# Patient Record
Sex: Female | Born: 1970 | Race: Black or African American | Hispanic: No | Marital: Married | Smoking: Light tobacco smoker
Health system: Southern US, Community
[De-identification: ages and names within clinical notes are randomized; demographics above are authoritative.]

## PROBLEM LIST (undated history)

## (undated) ENCOUNTER — Inpatient Hospital Stay (HOSPITAL_COMMUNITY): Admission: EM | Payer: Medicare Other

## (undated) DIAGNOSIS — I10 Essential (primary) hypertension: Secondary | ICD-10-CM

## (undated) DIAGNOSIS — Z9889 Other specified postprocedural states: Secondary | ICD-10-CM

## (undated) DIAGNOSIS — G5600 Carpal tunnel syndrome, unspecified upper limb: Secondary | ICD-10-CM

## (undated) DIAGNOSIS — M5020 Other cervical disc displacement, unspecified cervical region: Secondary | ICD-10-CM

## (undated) DIAGNOSIS — N289 Disorder of kidney and ureter, unspecified: Secondary | ICD-10-CM

## (undated) DIAGNOSIS — T8859XA Other complications of anesthesia, initial encounter: Secondary | ICD-10-CM

## (undated) DIAGNOSIS — K589 Irritable bowel syndrome without diarrhea: Secondary | ICD-10-CM

## (undated) DIAGNOSIS — R112 Nausea with vomiting, unspecified: Secondary | ICD-10-CM

## (undated) DIAGNOSIS — E7800 Pure hypercholesterolemia, unspecified: Secondary | ICD-10-CM

## (undated) DIAGNOSIS — M1990 Unspecified osteoarthritis, unspecified site: Secondary | ICD-10-CM

## (undated) HISTORY — PX: ABDOMINAL HYSTERECTOMY: SHX81

## (undated) HISTORY — PX: COLONOSCOPY: SHX174

## (undated) HISTORY — PX: CARPAL TUNNEL RELEASE: SHX101

## (undated) HISTORY — DX: Unspecified osteoarthritis, unspecified site: M19.90

## (undated) HISTORY — DX: Pure hypercholesterolemia, unspecified: E78.00

## (undated) HISTORY — PX: HAMMER TOE SURGERY: SHX385

## (undated) HISTORY — PX: CHOLECYSTECTOMY: SHX55

---

## 2000-07-17 ENCOUNTER — Other Ambulatory Visit: Admission: RE | Admit: 2000-07-17 | Discharge: 2000-07-17 | Payer: Self-pay | Admitting: Obstetrics and Gynecology

## 2001-06-11 ENCOUNTER — Ambulatory Visit (HOSPITAL_COMMUNITY): Admission: RE | Admit: 2001-06-11 | Discharge: 2001-06-11 | Payer: Self-pay | Admitting: Obstetrics and Gynecology

## 2001-07-18 ENCOUNTER — Encounter (INDEPENDENT_AMBULATORY_CARE_PROVIDER_SITE_OTHER): Payer: Self-pay

## 2001-07-18 ENCOUNTER — Ambulatory Visit (HOSPITAL_COMMUNITY): Admission: RE | Admit: 2001-07-18 | Discharge: 2001-07-18 | Payer: Self-pay | Admitting: Obstetrics and Gynecology

## 2001-10-16 ENCOUNTER — Encounter (INDEPENDENT_AMBULATORY_CARE_PROVIDER_SITE_OTHER): Payer: Self-pay

## 2001-10-16 ENCOUNTER — Inpatient Hospital Stay (HOSPITAL_COMMUNITY): Admission: RE | Admit: 2001-10-16 | Discharge: 2001-10-19 | Payer: Self-pay | Admitting: Obstetrics and Gynecology

## 2002-01-23 ENCOUNTER — Encounter: Admission: RE | Admit: 2002-01-23 | Discharge: 2002-01-23 | Payer: Self-pay | Admitting: Sports Medicine

## 2002-02-06 ENCOUNTER — Encounter: Admission: RE | Admit: 2002-02-06 | Discharge: 2002-02-06 | Payer: Self-pay | Admitting: Family Medicine

## 2003-12-23 ENCOUNTER — Ambulatory Visit: Payer: Self-pay | Admitting: Sports Medicine

## 2003-12-23 ENCOUNTER — Ambulatory Visit (HOSPITAL_COMMUNITY): Admission: RE | Admit: 2003-12-23 | Discharge: 2003-12-23 | Payer: Self-pay | Admitting: Sports Medicine

## 2004-01-13 ENCOUNTER — Ambulatory Visit: Payer: Self-pay | Admitting: Family Medicine

## 2004-02-03 ENCOUNTER — Other Ambulatory Visit: Admission: RE | Admit: 2004-02-03 | Discharge: 2004-02-03 | Payer: Self-pay | Admitting: Obstetrics and Gynecology

## 2004-02-10 ENCOUNTER — Encounter: Admission: RE | Admit: 2004-02-10 | Discharge: 2004-02-10 | Payer: Self-pay | Admitting: Obstetrics and Gynecology

## 2004-02-24 ENCOUNTER — Ambulatory Visit: Payer: Self-pay | Admitting: Sports Medicine

## 2004-03-10 ENCOUNTER — Ambulatory Visit: Payer: Self-pay | Admitting: Sports Medicine

## 2004-04-11 ENCOUNTER — Ambulatory Visit: Payer: Self-pay | Admitting: Sports Medicine

## 2004-08-01 ENCOUNTER — Encounter: Admission: RE | Admit: 2004-08-01 | Discharge: 2004-08-01 | Payer: Self-pay | Admitting: Obstetrics and Gynecology

## 2004-08-18 ENCOUNTER — Ambulatory Visit (HOSPITAL_BASED_OUTPATIENT_CLINIC_OR_DEPARTMENT_OTHER): Admission: RE | Admit: 2004-08-18 | Discharge: 2004-08-18 | Payer: Self-pay | Admitting: Orthopedic Surgery

## 2004-08-18 ENCOUNTER — Ambulatory Visit (HOSPITAL_COMMUNITY): Admission: RE | Admit: 2004-08-18 | Discharge: 2004-08-18 | Payer: Self-pay | Admitting: Orthopedic Surgery

## 2004-12-12 ENCOUNTER — Encounter: Admission: RE | Admit: 2004-12-12 | Discharge: 2004-12-12 | Payer: Self-pay | Admitting: Cardiology

## 2005-02-17 ENCOUNTER — Encounter: Admission: RE | Admit: 2005-02-17 | Discharge: 2005-02-17 | Payer: Self-pay | Admitting: Obstetrics and Gynecology

## 2005-04-05 ENCOUNTER — Encounter: Admission: RE | Admit: 2005-04-05 | Discharge: 2005-04-05 | Payer: Self-pay | Admitting: Neurology

## 2005-06-30 ENCOUNTER — Ambulatory Visit (HOSPITAL_BASED_OUTPATIENT_CLINIC_OR_DEPARTMENT_OTHER): Admission: RE | Admit: 2005-06-30 | Discharge: 2005-06-30 | Payer: Self-pay | Admitting: Orthopedic Surgery

## 2005-09-15 ENCOUNTER — Encounter: Admission: RE | Admit: 2005-09-15 | Discharge: 2005-09-15 | Payer: Self-pay | Admitting: Obstetrics and Gynecology

## 2005-11-24 ENCOUNTER — Encounter: Admission: RE | Admit: 2005-11-24 | Discharge: 2005-11-24 | Payer: Self-pay | Admitting: Gastroenterology

## 2006-05-17 DIAGNOSIS — E669 Obesity, unspecified: Secondary | ICD-10-CM

## 2006-05-17 DIAGNOSIS — I10 Essential (primary) hypertension: Secondary | ICD-10-CM

## 2006-05-17 DIAGNOSIS — D509 Iron deficiency anemia, unspecified: Secondary | ICD-10-CM | POA: Insufficient documentation

## 2006-08-31 ENCOUNTER — Ambulatory Visit (HOSPITAL_COMMUNITY): Admission: RE | Admit: 2006-08-31 | Discharge: 2006-08-31 | Payer: Self-pay | Admitting: Sports Medicine

## 2006-11-28 ENCOUNTER — Encounter: Admission: RE | Admit: 2006-11-28 | Discharge: 2006-11-28 | Payer: Self-pay | Admitting: Obstetrics and Gynecology

## 2007-03-26 ENCOUNTER — Encounter: Admission: RE | Admit: 2007-03-26 | Discharge: 2007-03-26 | Payer: Self-pay | Admitting: Cardiology

## 2007-08-01 ENCOUNTER — Emergency Department (HOSPITAL_COMMUNITY): Admission: EM | Admit: 2007-08-01 | Discharge: 2007-08-01 | Payer: Self-pay | Admitting: Emergency Medicine

## 2009-02-27 ENCOUNTER — Emergency Department (HOSPITAL_COMMUNITY): Admission: EM | Admit: 2009-02-27 | Discharge: 2009-02-27 | Payer: Self-pay | Admitting: Family Medicine

## 2010-04-09 ENCOUNTER — Encounter: Payer: Self-pay | Admitting: Obstetrics and Gynecology

## 2010-04-10 ENCOUNTER — Encounter: Payer: Self-pay | Admitting: Obstetrics and Gynecology

## 2010-04-10 ENCOUNTER — Encounter: Payer: Self-pay | Admitting: Orthopedic Surgery

## 2010-04-10 ENCOUNTER — Encounter: Payer: Self-pay | Admitting: Sports Medicine

## 2010-05-12 ENCOUNTER — Encounter (INDEPENDENT_AMBULATORY_CARE_PROVIDER_SITE_OTHER): Payer: Self-pay | Admitting: Internal Medicine

## 2010-05-12 ENCOUNTER — Telehealth (INDEPENDENT_AMBULATORY_CARE_PROVIDER_SITE_OTHER): Payer: Self-pay | Admitting: Internal Medicine

## 2010-05-12 ENCOUNTER — Encounter: Payer: Self-pay | Admitting: Internal Medicine

## 2010-05-12 DIAGNOSIS — K644 Residual hemorrhoidal skin tags: Secondary | ICD-10-CM | POA: Insufficient documentation

## 2010-05-12 DIAGNOSIS — Z8669 Personal history of other diseases of the nervous system and sense organs: Secondary | ICD-10-CM | POA: Insufficient documentation

## 2010-05-12 DIAGNOSIS — D259 Leiomyoma of uterus, unspecified: Secondary | ICD-10-CM | POA: Insufficient documentation

## 2010-05-12 DIAGNOSIS — L84 Corns and callosities: Secondary | ICD-10-CM | POA: Insufficient documentation

## 2010-05-12 DIAGNOSIS — M5030 Other cervical disc degeneration, unspecified cervical region: Secondary | ICD-10-CM | POA: Insufficient documentation

## 2010-05-12 DIAGNOSIS — K589 Irritable bowel syndrome without diarrhea: Secondary | ICD-10-CM | POA: Insufficient documentation

## 2010-05-12 LAB — CONVERTED CEMR LAB
Blood in Urine, dipstick: NEGATIVE
Glucose, Urine, Semiquant: NEGATIVE
Nitrite: NEGATIVE
Protein, U semiquant: 30
Specific Gravity, Urine: 1.02
Urobilinogen, UA: 1
WBC Urine, dipstick: NEGATIVE
pH: 6.5

## 2010-05-17 NOTE — Assessment & Plan Note (Signed)
Summary: New pt. to establish   Vital Signs:  Patient profile:   40 year old female Menstrual status:  hysterectomy Height:      68 inches Weight:      216 pounds BMI:     32.96 Temp:     98.3 degrees F oral Pulse rate:   72 / minute Pulse rhythm:   regular Resp:     22 per minute BP sitting:   134 / 96  (left arm) Cuff size:   regular  Vitals Entered By: Aquilla Solian CMA (May 12, 2010 11:34 AM) CC: Here to establish. Was at [facility]. Was Dr. Carma Leaven. Was dx w/IBS. Has had 2 colonoscopy and 2 endoscopy in the past. Having blood in stools. Denies any mucous. Have abd. pain. Dr. Micheline Chapman @ [facility] Medicine dx her w/arthritis in the groin area toes. Has plantation callus. Callus build up in hands as well. Needing BP meds.    Is Patient Diabetic? No Pain Assessment Patient in pain? yes       Does patient need assistance? Functional Status Self care Ambulation Normal     Menstrual Status hysterectomy   CC:  Here to establish. Was at [facility]. Was Dr. Carma Leaven. Was dx w/IBS. Has had 2 colonoscopy and 2 endoscopy in the past. Having blood in stools. Denies any mucous. Have abd. pain. Dr. Micheline Chapman @ [facility] dx her w/arthritis in the groin area toes. Has plantation callus. Callus build up in hands as well. Needing BP meds.   .  History of Present Illness: 40 yo female here to establish.  1.  Bowel issues:  Hx of IBS--diagnosed with Dr. Cristina Gong.  Having blood in stool and abdominal cramping.  States has been a problem for about 1 year.  Stools are soft.  Pt. describes BRBPR.  States only on tissue unless strains--states she strains sometimes even when stools are soft.  Tried multiple meds with Dr. Cristina Gong.  Never found a medication regiment that worked without significant side effects.  Dr. Cristina Gong had recommended she go to [facility] for research trials, which she never did.  Had 2 colonoscopies and 2 EGDs, all of which were  reportedly normal with Dr. Cristina Gong.  Pt. states also has difficulties with gag reflex and vomiting, which is also a part of her GI issues.  Has had hemorrhoids in past.  Has had a hemorrhoidectomy 10 years ago, but they have returned.  Describes external hemorrhoids.  Dr. Cristina Gong has told her there is nothing more he can do for her.  2.  Hypertension:  Takes Micardis/HCTZ.  Has been using only once every other week as she was running out.  Having headaches since not taking regularly  3.  Left Foot Pain:  Was going to Dr. Micheline Chapman at [facility].  Has a callous over 5th head of Metacarpal.  Was told bone would need to be shaved.  Has not been able to get into Dr. Micheline Chapman.    4.  Cold Intolerance:  has been this way for about 1 1/2 years.  Current Medications (verified): 1)  None  Allergies (verified): 1)  ! Penicillin  Past History:  Past Surgical History: 1.  08/18/1993:  Cholecystectomy  2.  07/18/2001:   colonoscopy/endoscopy--nl  3.  08/18/2001:  TAH for fibroids and menorrhagia with anemia 4.  08/18/2004:  Left Carpal Tunnel Release 5.  06/30/2005:  Right Carpal Tunnel Release  Family History: Mother, 32:  Lung Cancer, Breast Cancer, DM, hypertension Father, died 40:  MI, leukemia, gout, DM, Sickle Cell Trait 3 Sisters:  All with hypertension, IBS.  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 Lives with her adopted son, age 64 yo. Tobacco Use:  started age 29.  Smokes 1 pp week Alcohol:  occasional Drugs:  no history   Physical Exam  General:  Obese, NAD Lungs:  Normal respiratory effort, chest expands symmetrically. Lungs are clear to auscultation, no crackles or wheezes. Heart:  Normal rate and regular rhythm. S1 and S2 normal without gallop, murmur, click, rub or other extra sounds.  Radial pulses normal and equal Extremities:  No edema  Callous over metatarsal head area of  left 5th digit--tender.  No erythema.   Impression & Recommendations:  Problem # 1:  DEGENERATIVE DISC DISEASE, CERVICAL SPINE (ICD-722.4) Pt. was taking unknown muscle relaxant for neck/back--will call and let us know what it was.  Problem # 2:  CALLUSES, LEFT FOOT (ICD-700) Discussed wearing boxy toed shoes. Orders: UA Dipstick w/o Micro (automated)  (81003) Orthopedic Referral (Ortho)  Dr. Wolfgang Phoenix see if he takes Medicare.  Problem # 3:  EXTERNAL HEMORRHOIDS (ICD-455.3)  Orders: UA Dipstick w/o Micro (automated)  (81003)  Problem # 4:  IRRITABLE BOWEL SYNDROME (ICD-564.1) Need records from Dr. Cristina Gong Orders: UA Dipstick w/o Micro (automated)  (81003)  Problem # 5:  HYPERTENSION, BENIGN SYSTEMIC (ICD-401.1) Restart regular medication Her updated medication list for this problem includes:    Micardis Hct 80-12.5 Mg Tabs (Telmisartan-hctz) .Marland Kitchen... 1 tab by mouth daily  Orders: T-Comprehensive Metabolic Panel (A999333) UA Dipstick w/o Micro (automated)  (81003)  Complete Medication List: 1)  Micardis Hct 80-12.5 Mg Tabs (Telmisartan-hctz) .Marland Kitchen.. 1 tab by mouth daily 2)  Proctosol Hc 2.5 % Crea (Hydrocortisone) .... Apply three times a day to affected area (hemorrhoids) as needed  Other Orders: T-Lipid Profile [id number]) T-CBC w/Diff [id number])  Patient Instructions: 1)  Release of info from Dr. Cristina Gong.  2)  Release of info  Dr Micheline Chapman, Ouida Sills, [place] 3)  Follow up with Dr. Amil Amen in 4 months --CPE--no pap  Prescriptions: PROCTOSOL HC 2.5 % CREA (HYDROCORTISONE) apply three times a day to affected area (hemorrhoids) as needed  #60 g x 0   Entered and Authorized by:   Mack Hook MD   Signed by:   Mack Hook MD on 05/12/2010   Method used:   Electronically to        [facility] (retail)       [place], [place]  [id number]       Ph: [phone]       Fax: [phone]   [id number] MICARDIS HCT 80-12.5 MG TABS (TELMISARTAN-HCTZ) 1 tab by mouth daily  #30 x 11   Entered and Authorized by:   Mack Hook MD   Signed by:   Mack Hook MD on 05/12/2010   Method used:   Electronically to        [facility] (retail)       [place], [place]  [id number]       Ph: [phone]       Fax: [phone]   RxID:   [id number]    Orders Added: 1)  T-Lipid Profile [id number] 2)  T-Comprehensive  Metabolic Panel [id number] 3)  T-CBC w/Diff [id number] 4)  New Patient Level II [99202] 5)  UA Dipstick w/o Micro (automated)  [81003] 6)  Orthopedic Referral [Ortho]   Not Administered:    Influenza Vaccine not given due to: declined

## 2010-05-17 NOTE — Progress Notes (Signed)
  Phone Note Outgoing Call   Summary of Call: [name]--referral back to Dr. Micheline Chapman  ?  [facility]--not sure if this is [facility] or not.  She followed with him previously Initial call taken by: Mack Hook MD,  May 12, 2010 12:55 PM     Appended Document: PODIATRY REFERRAL  OOPs--signed off--sending via an append  Pt went to [facility] orthopedic . I called to schedule an appt and they told me that the pt have a balance of $30.00  she need to paid it  before we schedule an appt . I lvm to pt to call me back to let her know the situation.Maren Reamer  May 12, 2010 3:55 PM      Clinical Lists Changes

## 2010-05-20 ENCOUNTER — Encounter (INDEPENDENT_AMBULATORY_CARE_PROVIDER_SITE_OTHER): Payer: Self-pay | Admitting: Internal Medicine

## 2010-05-25 ENCOUNTER — Encounter (INDEPENDENT_AMBULATORY_CARE_PROVIDER_SITE_OTHER): Payer: Self-pay | Admitting: Internal Medicine

## 2010-05-25 ENCOUNTER — Telehealth (INDEPENDENT_AMBULATORY_CARE_PROVIDER_SITE_OTHER): Payer: Self-pay | Admitting: Internal Medicine

## 2010-05-25 DIAGNOSIS — E7800 Pure hypercholesterolemia, unspecified: Secondary | ICD-10-CM | POA: Insufficient documentation

## 2010-05-25 LAB — CONVERTED CEMR LAB
ALT: 12 units/L (ref 0–53)
AST: 15 units/L (ref 0–37)
Albumin: 4.4 g/dL (ref 3.5–5.2)
Alkaline Phosphatase: 62 units/L (ref 39–117)
BUN: 9 mg/dL (ref 6–23)
Basophils Absolute: 0 10*3/uL (ref 0.0–0.1)
Basophils Relative: 0 % (ref 0–1)
CO2: 21 meq/L (ref 19–32)
Calcium: 9.7 mg/dL (ref 8.4–10.5)
Chloride: 104 meq/L (ref 96–112)
Cholesterol: 218 mg/dL — ABNORMAL HIGH (ref 0–200)
Creatinine, Ser: 0.85 mg/dL (ref 0.40–1.50)
Eosinophils Absolute: 0 10*3/uL (ref 0.0–0.7)
Eosinophils Relative: 1 % (ref 0–5)
Glucose, Bld: 87 mg/dL (ref 70–99)
HCT: 39.8 % (ref 39.0–52.0)
HDL: 46 mg/dL (ref >39)
Hemoglobin: 13.1 g/dL (ref 13.0–17.0)
LDL Cholesterol: 156 mg/dL — ABNORMAL HIGH (ref 0–99)
Lymphocytes Relative: 44 % (ref 12–46)
Lymphs Abs: 2.5 10*3/uL (ref 0.7–4.0)
MCHC: 32.9 g/dL (ref 30.0–36.0)
MCV: 82.9 fL (ref 78.0–100.0)
Monocytes Absolute: 0.6 10*3/uL (ref 0.1–1.0)
Monocytes Relative: 10 % (ref 3–12)
Neutro Abs: 2.5 10*3/uL (ref 1.7–7.7)
Neutrophils Relative %: 45 % (ref 43–77)
Platelets: 237 10*3/uL (ref 150–400)
Potassium: 4.7 meq/L (ref 3.5–5.3)
RBC: 4.8 M/uL (ref 4.22–5.81)
RDW: 14.2 % (ref 11.5–15.5)
Sodium: 138 meq/L (ref 135–145)
Total Bilirubin: 0.7 mg/dL (ref 0.3–1.2)
Total CHOL/HDL Ratio: 4.7
Total Protein: 8 g/dL (ref 6.0–8.3)
Triglycerides: 79 mg/dL (ref <150)
VLDL: 16 mg/dL (ref 0–40)
WBC: 5.6 10*3/uL (ref 4.0–10.5)

## 2010-05-26 NOTE — Letter (Signed)
Summary: REQUESTING RECORDS FROM DR.DRAPER  REQUESTING RECORDS FROM DR.DRAPER   Imported By: Roland Earl 05/20/2010 12:20:38  _____________________________________________________________________  External Attachment:    Type:   Image     Comment:   External Document

## 2010-05-26 NOTE — Letter (Signed)
Summary: REQUESTING RECORDS FROM DR.[place]  REQUESTING RECORDS FROM DR.SPRIULL   Imported By: Roland Earl 05/20/2010 12:22:34  _____________________________________________________________________  External Attachment:    Type:   Image     Comment:   External Document

## 2010-05-26 NOTE — Letter (Signed)
Summary: REQUESTING RECORDS FROM DR.[place]  REQUESTING RECORDS FROM DR.[place]   Imported By: Roland Earl 05/20/2010 12:24:34  _____________________________________________________________________  External Attachment:    Type:   Image     Comment:   External Document

## 2010-05-26 NOTE — Letter (Signed)
Summary: REQUESTING RECORDS FROM DR.ANDERSON  REQUESTING RECORDS FROM DR.ANDERSON   Imported By: Roland Earl 05/20/2010 12:26:15  _____________________________________________________________________  External Attachment:    Type:   Image     Comment:   External Document

## 2010-05-31 NOTE — Assessment & Plan Note (Signed)
Summary: UA results   Allergies: 1)  ! Penicillin   Complete Medication List: 1)  Micardis Hct 80-12.5 Mg Tabs (Telmisartan-hctz) .Marland Kitchen.. 1 tab by mouth daily 2)  Proctosol Hc 2.5 % Crea (Hydrocortisone) .... Apply three times a day to affected area (hemorrhoids) as needed     Laboratory Results   Urine Tests  Date/Time Received: May 12, 2010 6:18 PM   Routine Urinalysis   Color: yellow Glucose: negative   (Normal Range: Negative) Bilirubin: small   (Normal Range: Negative) Ketone: small (15)   (Normal Range: Negative) Spec. Gravity: 1.020   (Normal Range: 1.003-1.035) Blood: negative   (Normal Range: Negative) pH: 6.5   (Normal Range: 5.0-8.0) Protein: 30   (Normal Range: Negative) Urobilinogen: 1.0   (Normal Range: 0-1) Nitrite: negative   (Normal Range: Negative) Leukocyte Esterace: negative   (Normal Range: Negative)

## 2010-05-31 NOTE — Progress Notes (Signed)
Summary: LAB RESULTS   Phone Note Call from Patient Call back at [phone]   Caller: Patient Reason for Call: Lab or Test Results Summary of Call: PT WANTS TO KNOW HER LAB RESULTS Derrek Monaco CALL HER @ [phone] [place]  Initial call taken by: Maren Reamer,  May 25, 2010 8:47 AM  Follow-up for Phone Call        Will foward to Dr. Amil Amen. -- Aquilla Solian CMA  May 25, 2010 10:39 AM

## 2010-05-31 NOTE — Letter (Signed)
Summary: Lipid Letter  [facility]  [address] [place], [place] [address]   Phone: [phone]  Fax: [phone]    05/25/2010  Veronica Pollard [address] [place], [place]  [address]  Dear Veronica Pollard:  We have carefully reviewed your last lipid profile from 05/12/2010 and the results are noted below with a summary of recommendations for lipid management.    Cholesterol:       218     Goal: <200   HDL "good" Cholesterol:   46     Goal: >45   LDL "bad" Cholesterol:   156     Goal: <100   Triglycerides:       79     Goal: <150    Total cholesterol and LDL are a bit elevated.  Encourage working on diet and physical activity and rechecking in 4-6 months.  Rest of labs, urine were fine.    TLC Diet (Therapeutic Lifestyle Change): Saturated Fats & Transfatty acids should be kept < 7% of total calories ***Reduce Saturated Fats Polyunstaurated Fat can be up to 10% of total calories Monounsaturated Fat Fat can be up to 20% of total calories Total Fat should be no greater than 25-35% of total calories Carbohydrates should be 50-60% of total calories Protein should be approximately 15% of total calories Fiber should be at least 20-30 grams a day ***Increased fiber may help lower LDL Total Cholesterol should be < 200mg /day Consider adding plant stanol/sterols to diet (example: Benacol spread) ***A higher intake of unsaturated fat may reduce Triglycerides and Increase HDL    Adjunctive Measures (may lower LIPIDS and reduce risk of Heart Attack) include: Aerobic Exercise (20-30 minutes 3-4 times a week) Limit Alcohol Consumption Weight Reduction Aspirin 75-81 mg a day by mouth (if not allergic or contraindicated) Dietary Fiber 20-30 grams a day by mouth     Current Medications: 1)    Micardis Hct 80-12.5 Mg Tabs (Telmisartan-hctz) .Marland Kitchen.. 1 tab by mouth daily 2)    Proctosol Hc 2.5 % Crea (Hydrocortisone) .... Apply three times a day to affected  area (hemorrhoids) as needed  If you have any questions, please call. We appreciate being able to work with you.   Sincerely,    [facility]

## 2010-06-13 ENCOUNTER — Other Ambulatory Visit: Payer: Self-pay | Admitting: Podiatry

## 2010-06-21 LAB — HEMOCCULT GUIAC POC 1CARD (OFFICE): Fecal Occult Bld: NEGATIVE

## 2010-08-05 NOTE — Discharge Summary (Signed)
   NAME:  Veronica Pollard, Veronica Pollard NO.:  [id number]   MEDICAL RECORD NO.:  [id number]                   PATIENT TYPE:  INP   LOCATION:  9309                                 FACILITY:  [facility]   PHYSICIAN:  Freda Munro, MD                   DATE OF BIRTH:  1970/10/30   DATE OF ADMISSION:  10/16/2001  DATE OF DISCHARGE:  10/19/2001                                 DISCHARGE SUMMARY   PRINCIPAL DISCHARGE DIAGNOSES:  1. Menometorrhagia.  2. Symptomatic uterine fibroids.  3. Severe anemia.   PRINCIPAL PROCEDURE:  Total abdominal hysterectomy.   HOSPITAL COURSE:  The patient is a 40 year old white female G-0, P-0 who  presented to [facility] on 10-16-01 for total abdominal hysterectomy  secondary to symptomatic uterine fibroids, severe anemia and  menometorrhagia.   Complete description of the event which lead up to this hospitalization can  be found in the dictated history and physical.   The patient underwent a total abdominal hysterectomy on 10-16-01. A complete  description of this procedure can be found on the dictated operative note.   The patient's pathology revealed a 342 gram uterus with multiple uterine  leiomyomata. No malignancy was identified. The patient's preoperative  hemoglobin was 7.6 and postop 8.0.   The patient's hospital course was uncomplicated. The patient was discharged  home on postoperative day #3.   At the time of discharge the patient was ambulating without difficulty.  She  was eating a regular diet. The patient was discharged to home and instructed  to follow up in the office in four weeks.  She was sent home with Motrin 600  mg p.o. q.d and Demerol.                                               Freda Munro, MD    MA/MEDQ  D:  11/11/2001  T:  11/11/2001  Job:  [id number]

## 2010-08-05 NOTE — Op Note (Signed)
[place]  Patient:    Veronica Pollard, Veronica Pollard Visit Number: [id number] MRN: [id number]          Service Type: DSU Location: [facility] Attending Physician:  Jules Husbands Dictated by:   Marney Setting Ouida Sills, M.D. Proc. Date: 07/18/01 Admit Date:  07/18/2001                             Operative Report  PREOPERATIVE DIAGNOSES:       1. Menorrhagia.                               2. Severe anemia.  POSTOPERATIVE DIAGNOSES:      1. Menorrhagia.                               2. Severe anemia.  OPERATION:                    Dilatation and curettage.  SURGEON:                      Mark E. Ouida Sills, M.D.  ANESTHESIA:                   MAC, paracervical block.  DRAINS:                       Red rubber catheter to bladder.  SPECIMENS:                    Endometrial curettings sent to pathology.  COMPLICATIONS:                None.  ESTIMATED BLOOD LOSS:         Minimal.  INDICATIONS:                  Ms. Veronica Pollard is a 40 year old black female who has a long history of uterine fibroids.  In the last six months, she has had increasing menorrhagia.  The patient was seen approximately one month ago and was found to have a hemoglobin of 6.1.  At that time, she was placed on Depo-Lupron and Procrit in an attempt to get her hemoglobin up for definitive surgery.  The patient is a Designer, television/film set and refuses blood products.  The patient has continued to bleed for the last three weeks.  Her hemorrhage has not changed during this time.  In an attempt to slow the bleeding, a D&C is scheduled for today.  DESCRIPTION OF PROCEDURE:     The patient was taken to the operating room where she was placed in the dorsal lithotomy position and MAC anesthesia was administered.  She was then prepped with Hibiclens and her bladder was drained with a red rubber catheter.  The sterile speculum was placed in the vagina.  A single-tooth tenaculum was applied and 20 cc of 1%  lidocaine was used for paracervical block.  The cervical os was then dilated to a 31-French.  The uterus was sounded to 11 cm.  The uterine curetting was then performed without significant tissue being removed.  At the conclusion of the procedure, minimum amount of bleeding was still noted.  The patient will be discharged to home. She will follow up in the office in five days. Dictated by:  Mark E. Ouida Sills, M.D. Attending Physician:  Jules Husbands DD:  07/18/01 TD:  07/20/01 Job: [id number]

## 2010-08-05 NOTE — H&P (Signed)
NAME:  Veronica Pollard, Veronica Pollard                         ACCOUNT NO.:  [id number]   MEDICAL RECORD NO.:  [id number]                   PATIENT TYPE:  INP   LOCATION:  9309                                 FACILITY:  [facility]   PHYSICIAN:  Mark E. Ouida Sills, M.D.              DATE OF BIRTH:  1971/03/01   DATE OF ADMISSION:  10/16/2001  DATE OF DISCHARGE:                                HISTORY & PHYSICAL   HISTORY OF PRESENT ILLNESS:  The patient is a 40 year old black female, G0,  P0, who presents today for a total abdominal hysterectomy secondary to  uterine fibroids, severe anemia, and menometrorrhagia.  The patient has had  this problem for multiple years.  She had been treated with oral  contraceptive pills, Lupron, Procrit, and a D&C.  The patient was offered  endometrial ablation, myomectomy and uterine embolization and declined these  options.  The patient did undergo a D&C after she was bleeding heavity in  May 2003.  The pathology revealed proliferative endometrium.  The patient's  hemoglobin continues to be in the 7's despite taking iron twice a day.  The  patient is a Jehovah Witness and refuses any blood products.  The patient  did undergo a colonoscopy to rule out other sources of blood loss.  This was  completely negative.   PAST MEDICAL HISTORY:  The patient has an allergy to penicillin which she  says causes her to stop breathing.   SOCIAL HISTORY:  The patient smokes rarely.   FAMILY HISTORY:  Significant for diabetes, heart disease and hypertension.   CURRENT MEDICATIONS:  Iron twice a day.  Most recent hemoglobin was 7.8.   PHYSICAL EXAMINATION:   GENERAL:  The patient is a well-developed, well-nourished white female.  She  is overweight, 260 pounds.   HEENT:  Within normal limits.   LUNGS:  Clear to auscultation.   CARDIOVASCULAR:  Regular rate and rhythm without murmur.   BREASTS:  Without lesions or tenderness.  There is no lymphadenopathy.   ABDOMEN:  Soft,  nontender, nondistended.  There is no organomegaly, no  masses.  She has no rebound or guarding.   EXTREMITIES:  Within normal limits.   PELVIC:  The patient has normal external genitalia.  Vagina is without  lesions or discharge.  The cervix is nulliparous.  Uterus is functionally 14  week size.  There are no adnexal masses.  The uterus is lobulated consistent  with fibroids.   IMPRESSION:  1. Uterine fibroids.  2. Severe anemia.  3. Menometrorrhagia   PLAN:  Proceed with total abdominal hysterectomy.                                                 Mark E. Ouida Sills, M.D.    MEA/MEDQ  D:  10/16/2001  T:  10/18/2001  Job:  [id number]

## 2010-08-05 NOTE — Op Note (Signed)
NAMEMARREN, LULL NO.:  [id number]   MEDICAL RECORD NO.:  [id number]          PATIENT TYPE:  AMB   LOCATION:  [place]                          FACILITY:  [facility]   PHYSICIAN:  Youlanda Mighty. Sypher, M.D. DATE OF BIRTH:  1970/12/15   DATE OF PROCEDURE:  08/18/2004  DATE OF DISCHARGE:                                 OPERATIVE REPORT   REFERRING PHYSICIAN:  Lucianne Lei, M.D.   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   OPERATION:  Release of left transverse carpal ligament.   OPERATING SURGEON:  Youlanda Mighty. Sypher, M.D.   ASSISTANT:  Herbie Baltimore Dasnoit PA-C.   ANESTHESIA:  General by LMA.   SUPERVISING ANESTHESIOLOGIST:  Dr. Albertina Parr.   INDICATIONS:  Veronica Pollard is a 40 year old woman referred through the  courtesy of Dr. Lucianne Lei for evaluation and management of bilateral hand  numbness. Clinical examination suggested carpal tunnel syndrome.  Electrodiagnostic studies completed by Dr. Zebedee Iba confirmed bilateral  median neuropathy at wrist level.   Due to failed to respond to nonoperative measures, she is brought to the  operating room at this time for release of her left transverse carpal  ligament.   PROCEDURE:  Veronica Pollard is brought to the operating room and placed in  supine position upon the operating table.   Following the induction of general anesthesia by LMA, the left arm was  prepped with Betadine soap and solution and sterilely draped.   Following exsanguination the left arm with an Esmarch bandage, an arterial  tourniquet on the proximal brachium was inflated to 220 mmHg.   Procedure commenced with a short incision in the line of the ring finger and  the palm.  Subcutaneous tissues were carefully divided revealing the palmar  fascia. This was split longitudinally to reveal the common sensory  branch of  the median nerve.   These were followed back to transverse carpal ligament which was gently  isolated from the median nerve.   The transverse carpal ligament was released along its ulnar border extending  into the distal forearm. This widely opened carpal canal. No masses or other  predicaments were noted.   Bleeding points along the margin of the released ligament were  electrocauterized with bipolar current followed by repair the skin with  intradermal 3-0 Prolene suture.   A compressive dressing was applied with a volar plaster splint maintaining  the wrist in 5 degrees of dorsiflexion.      RVS/MEDQ  D:  08/18/2004  T:  08/18/2004  Job:  [id number]   cc:   Lucianne Lei, M.D.  [phone], [address]  [place]  [place] [address]  Fax: [phone]

## 2010-08-05 NOTE — Op Note (Signed)
NAME:  Veronica Pollard, Veronica Pollard                         ACCOUNT NO.:  [id number]   MEDICAL RECORD NO.:  [id number]                   PATIENT TYPE:  INP   LOCATION:  9309                                 FACILITY:  [facility]   PHYSICIAN:  Mark E. Ouida Sills, M.D.              DATE OF BIRTH:  October 27, 1970   DATE OF PROCEDURE:  10/16/2001  DATE OF DISCHARGE:  10/19/2001                                 OPERATIVE REPORT   PREOPERATIVE DIAGNOSES:  1. Uterine fibroids.  2. Severe anemia.  3. Menorrhagia.   POSTOPERATIVE DIAGNOSES:  1. Uterine fibroids.  2. Severe anemia.  3. Menorrhagia.  4. The patient is a Restaurant manager, fast food.   PROCEDURE:  Total abdominal hysterectomy.   ANESTHESIA:  General endotracheal.   ANTIBIOTICS:  Ancef 1 g.   SURGEON:  Mark E. Ouida Sills, M.D.   ASSISTANT:  Dr. Deatra Ina.   COMPLICATIONS:  None.   SPECIMENS:  Cervix and uterus sent to pathology.   FINDINGS:  The patient had a normal-appearing liver.  The right kidney  appeared slightly larger than the left kidney.  However, no abnormalities  were felt on it.  There was no abdominal or pelvic lymphadenopathy.  There  were several small adhesions between the colon and the posterior aspect of  the uterus which were taken down sharply.  Ovaries were normal bilaterally.  There were multiple fibroids in the uterus, making it approximately 14 weeks  in size.   DESCRIPTION OF PROCEDURE:  The patient was taken to the operating room where  a general anesthetic was administered without complication.  She was then  prepped with Hibiclens and a Foley catheter was placed.  She was draped in  the usual fashion for this procedure.  A Pfannenstiel incision was made and  it was carried down to the fascia.  The fascia was entered in the midline  and extended laterally with the Mayo scissors.  The rectus muscles were then  dissected from the fascia with the Bovie.  The rectus muscles were divided  in the midline and taken superiorly and  inferiorly.  The parietal peritoneum  was entered sharply and taken superiorly and inferiorly.  An examination of  the abdominal and pelvic contents was undertaken and findings are as noted  above.  The adhesions to the posterior aspect of the uterus were taken down  with the Bovie.  At this point, the O'Connor-O'Sullivan retractor was placed  and the bowel packed away.  The right round ligament was then ligated with 0  Monocryl suture and transected with the Bovie.  The anterior and posterior  leaf of the broad ligament was then opened.  The ureter was identified.  At  this point, a clamp was placed over the ovarian ligament which was then cut  and tied with 2-0 Monocryl sutures.  The bladder flap was then opened.  The  uterine vessels were skeletonized, clamped, cut, and ligated with  0 Monocryl  suture.  On the left, the patient had a large fibroid extending into the  broad ligament, making it difficult for dissection.  First, the round  ligament was grasped, ligated with 0 Monocryl suture, and transected with  the Bovie posteriorly for the broad ligament were then opened and the ureter  identified.  With the ____________ well out of the field, the remaining  bladder flap was then opened.  The uterine vessel was isolated and ligated  with 0 Monocryl suture.  At this point, the uterus was transected from the  cervix, and a thyroid clamp was placed on the cervix.  Next, the cardinal  ligaments were serially bilaterally clamped, cut, and ligated with 0  Monocryl suture.  Once the level of the external os was reached, the vagina  was entered and circumscribed.  The vaginal angles were closed using 0  Monocryl suture in a Heaney fashion.  The remaining vaginal cuff was closed  using interrupted 0 Monocryl sutures in a figure-of-eight fashion.  The  pelvis was then copiously irrigated.  Small areas of bleeding were made  hemostatic with the Bovie.  The ovaries were then attached to the round   ligament with 2-0 Monocryl suture.  This concluded the procedure.  The laps  were removed and retractors removed.  The parietal peritoneum and rectus  muscles were reapproximated in the midline using 2-0 Monocryl in a running  fashion.  The fascia was closed using 0 Monocryl suture in a running  fashion.  The subcuticular tissue was made hemostatic with Bovie and then  closure using interrupted 2-0 plain gut suture.  The stainless steel clips  were then used to close the skin.  The patient tolerated the procedure well.  She was taken to the recovery room in stable condition.  Instrument counts  and lap counts were correct x2.                                                Mark E. Ouida Sills, M.D.    MEA/MEDQ  D:  10/16/2001  T:  10/21/2001  Job:  [id number]

## 2010-08-05 NOTE — Op Note (Signed)
Veronica Pollard, SAUNDERS NO.:  [id number]   MEDICAL RECORD NO.:  [id number]          PATIENT TYPE:  AMB   LOCATION:  [place]                          FACILITY:  [facility]   PHYSICIAN:  Youlanda Mighty. Sypher, M.D. 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   OPERATIONS:  Release of right transcarpal ligament.   OPERATIONS:  Dr. Herbie [place] Sypher   ASSISTANT:  Leverne Humbles PA-C.   ANESTHESIA:  General by LMA.   SUPERVISING ANESTHESIOLOGIST:  Dr. Linna Caprice   INDICATIONS:  [name] is a 39 year old woman referred for evaluation  and management of bilateral hand discomfort and numbness.  She is status  post left carpal tunnel release with good results.  She now presents for  similar surgery on the right.  Preoperative electrodiagnostic studies  documented significant median entrapment neuropathy bilaterally.   PROCEDURE:  [name] is brought to the operating room and placed in  supine position on the operating room table.  Following induction of general  anesthesia by LMA, the right arm was prepped with Betadine soap and solution  and sterilely draped.  A pneumatic tourniquet was applied to the proximal  right brachium.   Following exsanguination of the right arm an Esmarch bandage, the arterial  tourniquet is inflated to 220 mmHg.  The procedure commenced with a short  incision in the line of the ring finger and the palm.  Subcutaneous tissues  were carefully divided revealing palmar fascia.  __________ Harl Bowie of the  median nerve.  The median nerve proper was gently isolated from the  transcarpal ligament.  The ligament was then released with scissors along  its ulnar border extending into the distal forearm.  This widely opened  the  carpal canal.  No masses or other pigments were noted.   Bleeding points along the margin of the released ligament were  electrocauterized with bipolar current followed by repair of the skin with  intradermal 3-0 Prolene suture.   A compressive dressing was applied with a volar plaster splint maintaining  the wrist in 5 degrees of dorsiflexion.   There no apparent complications.   [name] tolerated surgery and anesthesia well.  She is transferred to the  recovery room with stable vital signs.      Youlanda Mighty Sypher, M.D.  Electronically Signed     RVS/MEDQ  D:  06/30/2005  T:  06/30/2005  Job:  [id number]

## 2010-10-28 ENCOUNTER — Other Ambulatory Visit: Payer: Self-pay | Admitting: Family Medicine

## 2010-10-28 ENCOUNTER — Inpatient Hospital Stay (INDEPENDENT_AMBULATORY_CARE_PROVIDER_SITE_OTHER)
Admission: RE | Admit: 2010-10-28 | Discharge: 2010-10-28 | Disposition: A | Discharge status: Home / Self Care | Payer: Medicare Other | Source: Ambulatory Visit | Attending: Family Medicine | Admitting: Family Medicine

## 2010-10-28 DIAGNOSIS — N6452 Nipple discharge: Secondary | ICD-10-CM

## 2010-10-28 DIAGNOSIS — N6459 Other signs and symptoms in breast: Secondary | ICD-10-CM

## 2010-11-02 ENCOUNTER — Ambulatory Visit
Admission: RE | Admit: 2010-11-02 | Discharge: 2010-11-02 | Disposition: A | Discharge status: Home / Self Care | Payer: Medicare Other | Source: Ambulatory Visit | Attending: Family Medicine | Admitting: Family Medicine

## 2010-11-02 ENCOUNTER — Other Ambulatory Visit: Payer: Self-pay | Admitting: Family Medicine

## 2010-11-02 DIAGNOSIS — N6452 Nipple discharge: Secondary | ICD-10-CM

## 2011-07-08 ENCOUNTER — Encounter (HOSPITAL_COMMUNITY): Payer: Self-pay

## 2011-07-08 ENCOUNTER — Emergency Department (HOSPITAL_COMMUNITY)
Admission: EM | Admit: 2011-07-08 | Discharge: 2011-07-08 | Disposition: A | Discharge status: Home / Self Care | Payer: 59 | Source: Home / Self Care | Attending: Emergency Medicine | Admitting: Emergency Medicine

## 2011-07-08 DIAGNOSIS — L851 Acquired keratosis [keratoderma] palmaris et plantaris: Secondary | ICD-10-CM

## 2011-07-08 DIAGNOSIS — L859 Epidermal thickening, unspecified: Secondary | ICD-10-CM

## 2011-07-08 HISTORY — DX: Essential (primary) hypertension: I10

## 2011-07-08 MED ORDER — SALICYLIC ACID 17 % EX SOLN: Freq: Every day | CUTANEOUS | Status: AC

## 2011-07-08 NOTE — ED Provider Notes (Signed)
Chief Complaint  Patient presents with  . Foot Pain    History of Present Illness:   The patient is had a two-year history of a painful keratosis on the left foot. She saw a podiatrist who did a hammertoe repair 9 months ago and this was supposed to have cleared up, but she continues to have the painful keratosis. She's had apparent at several times and this does seem to help comes back again.  Review of Systems:  Other than noted above, the patient denies any of the following symptoms: Systemic:  No fevers, chills, sweats, or aches.  No fatigue or tiredness. Musculoskeletal:  No joint pain, arthritis, bursitis, swelling, back pain, or neck pain. Neurological:  No muscular weakness, paresthesias, headache, or trouble with speech or coordination.  No dizziness.   [place]:  Past medical history, family history, social history, meds, and allergies were reviewed.  Physical Exam:   Vital signs:  BP 157/100  Pulse 84  Temp(Src) 98.3 F (36.8 C) (Oral)  Resp 18  SpO2 99% Gen:  Alert and oriented times 3.  In no distress. Musculoskeletal: She has a large keratosis on the plantar aspect of the left foot located over the mid fifth metatarsal. Otherwise, all joints had a full a ROM with no swelling, bruising or deformity.  No edema, pulses full. Extremities were warm and pink.  Capillary refill was brisk.  Skin:  Clear, warm and dry.  No rash. Neuro:  Alert and oriented times 3.  Muscle strength was normal.  Sensation was intact to light touch.   Procedure Note:  Verbal informed consent was obtained from the patient.  Risks and benefits were outlined with the patient.  Patient understands and accepts these risks.  Identity of the patient was confirmed verbally and by armband.    Procedure was performed as followed:  The area was prepped with alcohol and the keratosis was. Off. This became somewhat painful for her, so I anesthetized area with 2 mL of 2% Xylocaine without epinephrine. Thereafter she  was able to tolerate it better and was able to peer off the entire callus. She had a couple of soft corns underneath that and these were removed as best as possible. He was instructed to apply DuoFilm and soak her foot daily.  Patient tolerated the procedure well without any immediate complications.  Assessment:  The encounter diagnosis was Hyperkeratosis of sole.  Plan:   1.  The following meds were prescribed:   New Prescriptions   SALICYLIC ACID-LACTIC ACID 17 % EXTERNAL SOLUTION    Apply topically daily.   2.  The patient was instructed in symptomatic care, including rest and activity, elevation, application of ice and compression.  Appropriate handouts were given. 3.  The patient was told to return if becoming worse in any way, if no better in 3 or 4 days, and given some red flag symptoms that would indicate earlier return.   4.  The patient was told to follow up with Dr. Elease Hashimoto for further evaluation and definitive treatment so this won't come back again.   Harden Mo, MD 07/08/11 [phone]

## 2011-07-08 NOTE — Discharge Instructions (Signed)
Apply medication daily, cover with adhesive tape, remove after 24 hours, soak in warm water with Epsom Salts for 10 minutes.

## 2011-07-08 NOTE — ED Notes (Signed)
Pt has had lt foot pain for one year, she went to podiatrist and he told her it was a callous and he did surgery on her hammer toe and never addressed the callous.  Has heel and outer foot pain.

## 2012-02-06 ENCOUNTER — Other Ambulatory Visit: Payer: Self-pay | Admitting: Family Medicine

## 2012-02-06 DIAGNOSIS — Z1231 Encounter for screening mammogram for malignant neoplasm of breast: Secondary | ICD-10-CM

## 2012-03-19 ENCOUNTER — Other Ambulatory Visit: Payer: Self-pay | Admitting: Family Medicine

## 2012-03-19 ENCOUNTER — Ambulatory Visit
Admission: RE | Admit: 2012-03-19 | Discharge: 2012-03-19 | Disposition: A | Discharge status: Home / Self Care | Payer: 59 | Source: Ambulatory Visit | Attending: Family Medicine | Admitting: Family Medicine

## 2012-03-19 DIAGNOSIS — N63 Unspecified lump in unspecified breast: Secondary | ICD-10-CM

## 2012-03-19 DIAGNOSIS — Z1231 Encounter for screening mammogram for malignant neoplasm of breast: Secondary | ICD-10-CM

## 2012-03-20 HISTORY — PX: BREAST BIOPSY: SHX20

## 2012-06-21 ENCOUNTER — Ambulatory Visit
Admission: RE | Admit: 2012-06-21 | Discharge: 2012-06-21 | Disposition: A | Discharge status: Home / Self Care | Payer: Commercial Indemnity | Source: Ambulatory Visit | Attending: Family Medicine | Admitting: Family Medicine

## 2012-06-21 ENCOUNTER — Other Ambulatory Visit: Payer: Self-pay | Admitting: Family Medicine

## 2012-06-21 DIAGNOSIS — N63 Unspecified lump in unspecified breast: Secondary | ICD-10-CM

## 2012-06-24 ENCOUNTER — Ambulatory Visit
Admission: RE | Admit: 2012-06-24 | Discharge: 2012-06-24 | Disposition: A | Discharge status: Home / Self Care | Payer: Managed Care, Other (non HMO) | Source: Ambulatory Visit | Attending: Family Medicine | Admitting: Family Medicine

## 2012-06-24 ENCOUNTER — Other Ambulatory Visit: Payer: Self-pay | Admitting: Family Medicine

## 2012-06-24 DIAGNOSIS — N63 Unspecified lump in unspecified breast: Secondary | ICD-10-CM

## 2013-02-10 ENCOUNTER — Other Ambulatory Visit: Payer: Self-pay | Admitting: Family Medicine

## 2013-02-10 DIAGNOSIS — M79672 Pain in left foot: Secondary | ICD-10-CM

## 2013-02-10 DIAGNOSIS — E049 Nontoxic goiter, unspecified: Secondary | ICD-10-CM

## 2013-02-10 DIAGNOSIS — M549 Dorsalgia, unspecified: Secondary | ICD-10-CM

## 2013-02-17 ENCOUNTER — Ambulatory Visit
Admission: RE | Admit: 2013-02-17 | Discharge: 2013-02-17 | Disposition: A | Discharge status: Home / Self Care | Payer: Managed Care, Other (non HMO) | Source: Ambulatory Visit | Attending: Family Medicine | Admitting: Family Medicine

## 2013-02-17 DIAGNOSIS — M549 Dorsalgia, unspecified: Secondary | ICD-10-CM

## 2013-02-17 DIAGNOSIS — M79672 Pain in left foot: Secondary | ICD-10-CM

## 2013-02-17 DIAGNOSIS — E049 Nontoxic goiter, unspecified: Secondary | ICD-10-CM

## 2013-07-22 ENCOUNTER — Other Ambulatory Visit: Payer: Self-pay

## 2013-07-22 DIAGNOSIS — Z1231 Encounter for screening mammogram for malignant neoplasm of breast: Secondary | ICD-10-CM

## 2013-08-18 ENCOUNTER — Encounter (INDEPENDENT_AMBULATORY_CARE_PROVIDER_SITE_OTHER): Payer: Self-pay

## 2013-08-18 ENCOUNTER — Ambulatory Visit
Admission: RE | Admit: 2013-08-18 | Discharge: 2013-08-18 | Disposition: A | Discharge status: Home / Self Care | Payer: Managed Care, Other (non HMO) | Source: Ambulatory Visit

## 2013-08-18 DIAGNOSIS — Z1231 Encounter for screening mammogram for malignant neoplasm of breast: Secondary | ICD-10-CM

## 2014-05-07 ENCOUNTER — Emergency Department (HOSPITAL_COMMUNITY)
Admission: EM | Admit: 2014-05-07 | Discharge: 2014-05-07 | Disposition: A | Discharge status: Home / Self Care | Payer: Commercial Indemnity | Attending: Emergency Medicine | Admitting: Emergency Medicine

## 2014-05-07 ENCOUNTER — Emergency Department (HOSPITAL_COMMUNITY): Payer: Commercial Indemnity

## 2014-05-07 ENCOUNTER — Encounter (HOSPITAL_COMMUNITY): Payer: Self-pay | Admitting: *Deleted

## 2014-05-07 DIAGNOSIS — R0602 Shortness of breath: Secondary | ICD-10-CM | POA: Insufficient documentation

## 2014-05-07 DIAGNOSIS — R600 Localized edema: Secondary | ICD-10-CM | POA: Insufficient documentation

## 2014-05-07 DIAGNOSIS — Z79899 Other long term (current) drug therapy: Secondary | ICD-10-CM | POA: Insufficient documentation

## 2014-05-07 DIAGNOSIS — I10 Essential (primary) hypertension: Secondary | ICD-10-CM | POA: Insufficient documentation

## 2014-05-07 DIAGNOSIS — R609 Edema, unspecified: Secondary | ICD-10-CM

## 2014-05-07 DIAGNOSIS — Z880 Allergy status to penicillin: Secondary | ICD-10-CM | POA: Insufficient documentation

## 2014-05-07 DIAGNOSIS — Z87448 Personal history of other diseases of urinary system: Secondary | ICD-10-CM | POA: Insufficient documentation

## 2014-05-07 DIAGNOSIS — Z87891 Personal history of nicotine dependence: Secondary | ICD-10-CM | POA: Diagnosis not present

## 2014-05-07 DIAGNOSIS — M7989 Other specified soft tissue disorders: Secondary | ICD-10-CM | POA: Diagnosis present

## 2014-05-07 HISTORY — DX: Disorder of kidney and ureter, unspecified: N28.9

## 2014-05-07 LAB — CBC
HCT: 33.7 % — ABNORMAL LOW (ref 36.0–46.0)
HEMOGLOBIN: 10.7 g/dL — AB (ref 12.0–15.0)
MCH: 25.7 pg — ABNORMAL LOW (ref 26.0–34.0)
MCHC: 31.8 g/dL (ref 30.0–36.0)
MCV: 81 fL (ref 78.0–100.0)
PLATELETS: 298 10*3/uL (ref 150–400)
RBC: 4.16 MIL/uL (ref 3.87–5.11)
RDW: 14.6 % (ref 11.5–15.5)
WBC: 8.1 10*3/uL (ref 4.0–10.5)

## 2014-05-07 LAB — URINALYSIS, ROUTINE W REFLEX MICROSCOPIC
BILIRUBIN URINE: NEGATIVE
Glucose, UA: NEGATIVE mg/dL
Hgb urine dipstick: NEGATIVE
Ketones, ur: NEGATIVE mg/dL
Leukocytes, UA: NEGATIVE
Nitrite: NEGATIVE
PROTEIN: NEGATIVE mg/dL
Specific Gravity, Urine: 1.024 (ref 1.005–1.030)
UROBILINOGEN UA: 0.2 mg/dL (ref 0.0–1.0)
pH: 6 (ref 5.0–8.0)

## 2014-05-07 LAB — COMPREHENSIVE METABOLIC PANEL
ALBUMIN: 3.4 g/dL — AB (ref 3.5–5.2)
ALK PHOS: 60 U/L (ref 39–117)
ALT: 22 U/L (ref 0–35)
AST: 24 U/L (ref 0–37)
Anion gap: 7 (ref 5–15)
BILIRUBIN TOTAL: 0.7 mg/dL (ref 0.3–1.2)
BUN: 10 mg/dL (ref 6–23)
CHLORIDE: 102 mmol/L (ref 96–112)
CO2: 27 mmol/L (ref 19–32)
Calcium: 9 mg/dL (ref 8.4–10.5)
Creatinine, Ser: 0.78 mg/dL (ref 0.50–1.10)
GFR calc Af Amer: >90 mL/min (ref >90)
GFR calc non Af Amer: >90 mL/min (ref >90)
Glucose, Bld: 136 mg/dL — ABNORMAL HIGH (ref 70–99)
POTASSIUM: 4.1 mmol/L (ref 3.5–5.1)
Sodium: 136 mmol/L (ref 135–145)
Total Protein: 7.1 g/dL (ref 6.0–8.3)

## 2014-05-07 LAB — BRAIN NATRIURETIC PEPTIDE: B Natriuretic Peptide: 19.4 pg/mL (ref 0.0–100.0)

## 2014-05-07 NOTE — ED Notes (Signed)
Patient returned from X-ray 

## 2014-05-07 NOTE — ED Notes (Signed)
Patient transported to X-ray 

## 2014-05-07 NOTE — ED Notes (Addendum)
Pt states swelling to all limbs and neck x 1 months.  States she has gained approx 20 lbs in the last month.  She called her md who stated to come to ED.  Denies orthopnea.

## 2014-05-07 NOTE — Discharge Instructions (Signed)
Please follow up with your doctor for further evaluation of your condition.  Return if your condition is worsen.  No obvious signs to suggest congestive heart failure or kidney failure on today's exam.   Edema Edema is an abnormal buildup of fluids in your bodytissues. Edema is somewhatdependent on gravity to pull the fluid to the lowest place in your body. That makes the condition more common in the legs and thighs (lower extremities). Painless swelling of the feet and ankles is common and becomes more likely as you get older. It is also common in looser tissues, like around your eyes.  When the affected area is squeezed, the fluid may move out of that spot and leave a dent for a few moments. This dent is called pitting.  CAUSES  There are many possible causes of edema. Eating too much salt and being on your feet or sitting for a long time can cause edema in your legs and ankles. Hot weather may make edema worse. Common medical causes of edema include:  Heart failure.  Liver disease.  Kidney disease.  Weak blood vessels in your legs.  Cancer.  An injury.  Pregnancy.  Some medications.  Obesity. SYMPTOMS  Edema is usually painless.Your skin may look swollen or shiny.  DIAGNOSIS  Your health care provider may be able to diagnose edema by asking about your medical history and doing a physical exam. You may need to have tests such as X-rays, an electrocardiogram, or blood tests to check for medical conditions that may cause edema.  TREATMENT  Edema treatment depends on the cause. If you have heart, liver, or kidney disease, you need the treatment appropriate for these conditions. General treatment may include:  Elevation of the affected body part above the level of your heart.  Compression of the affected body part. Pressure from elastic bandages or support stockings squeezes the tissues and forces fluid back into the blood vessels. This keeps fluid from entering the  tissues.  Restriction of fluid and salt intake.  Use of a water pill (diuretic). These medications are appropriate only for some types of edema. They pull fluid out of your body and make you urinate more often. This gets rid of fluid and reduces swelling, but diuretics can have side effects. Only use diuretics as directed by your health care provider. HOME CARE INSTRUCTIONS   Keep the affected body part above the level of your heart when you are lying down.   Do not sit still or stand for prolonged periods.   Do not put anything directly under your knees when lying down.  Do not wear constricting clothing or garters on your upper legs.   Exercise your legs to work the fluid back into your blood vessels. This may help the swelling go down.   Wear elastic bandages or support stockings to reduce ankle swelling as directed by your health care provider.   Eat a low-salt diet to reduce fluid if your health care provider recommends it.   Only take medicines as directed by your health care provider. SEEK MEDICAL CARE IF:   Your edema is not responding to treatment.  You have heart, liver, or kidney disease and notice symptoms of edema.  You have edema in your legs that does not improve after elevating them.   You have sudden and unexplained weight gain. SEEK IMMEDIATE MEDICAL CARE IF:   You develop shortness of breath or chest pain.   You cannot breathe when you lie down.  You  develop pain, redness, or warmth in the swollen areas.   You have heart, liver, or kidney disease and suddenly get edema.  You have a fever and your symptoms suddenly get worse. MAKE SURE YOU:   Understand these instructions.  Will watch your condition.  Will get help right away if you are not doing well or get worse. Document Released: 03/06/2005 Document Revised: 07/21/2013 Document Reviewed: 12/27/2012 [facility] Patient Information 2015 [place], [place]. This information is not intended to  replace advice given to you by your health care provider. Make sure you discuss any questions you have with your health care provider.

## 2014-05-07 NOTE — ED Provider Notes (Signed)
CSN: [id number]     Arrival date & time 05/07/14  [id number] History   First MD Initiated Contact with Patient 05/07/14 0902     Chief Complaint  Patient presents with  . Leg Swelling  . Shortness of Breath     (Consider location/radiation/quality/duration/timing/severity/associated sxs/prior Treatment) HPI   44 year old female with hx of HTN and renal disease presents c/o leg swelling.  Pt report for the past 1-2 month patient has noticed increased weight gain a total of 20 pounds, having bilateral leg swelling along with facial swelling, and hands swelling. She described no leg pain but a tightness sensation to both legs. She denies having any significant shortness of breath but states that sometimes she has to take a deep breath. She admits to having history of high blood pressure and currently taking her medication as prescribed. There has been no change in her diet, and states she does not consume a lot of high sodium content food. No complaint of fever, chest pain, abdominal pain, nausea vomiting diarrhea, numbness or weakness. No prior history of PE or DVT, no recent surgery, prolonged bed rest, unilateral leg swelling or calf pain, history of cancer, or hemoptysis. She does not take any oral hormone. No prior history of CHF. Patient was a former smoker. Patient did try to follow-up with her primary care provider but when she called, they recommend patient to come to the ER for further evaluation.  Past Medical History  Diagnosis Date  . Hypertension   . Renal disorder     early stage kidney disease   Past Surgical History  Procedure Laterality Date  . Abdominal hysterectomy    . Carpal tunnel release    . Hammer toe surgery     No family history on file. History  Substance Use Topics  . Smoking status: Former Smoker    Quit date: 05/07/2012  . Smokeless tobacco: Not on file  . Alcohol Use: Yes     Comment: socially   OB History    No data available     Review of Systems   All other systems reviewed and are negative.     Allergies  Penicillins and Penicillins  Home Medications   Prior to Admission medications   Medication Sig Start Date End Date Taking? Authorizing Provider  valsartan-hydrochlorothiazide (DIOVAN HCT) 320-12.5 MG per tablet Take 1 tablet by mouth daily.    Historical Provider, MD   There were no vitals taken for this visit. Physical Exam  Constitutional: She is oriented to person, place, and time. She appears well-developed and well-nourished. No distress.  HENT:  Head: Atraumatic.  Eyes: Conjunctivae are normal.  Neck: Neck supple. No JVD present.  Cardiovascular: Normal rate.  Exam reveals no gallop and no friction rub.   No murmur heard. Pulmonary/Chest: Effort normal and breath sounds normal. No respiratory distress. She has no wheezes. She has no rales.  Abdominal: Soft. There is no tenderness.  Negative hepatojugular reflex  Musculoskeletal: She exhibits no edema or tenderness.  BLE: no palpable cords, erythema, or edema.    Neurological: She is alert and oriented to person, place, and time.  Skin: No rash noted.  Psychiatric: She has a normal mood and affect.  Nursing note and vitals reviewed.   ED Course  Procedures (including critical care time)  Patient report fluid gain, approximately 20 pounds within the past 1-2 months. She denies any orthopnea, sleeps with one pillow, does not have any signs of overt retention on exam  however difficult to appreciate due to large body habitus. She has no active chest pain or shortness of breath, speaking complete sentences, and currently in no acute distress. Suspect either renal insufficiency versus early signs of congestive heart failure. I doubt DVT or infection as the cause. Workup initiated.  12:20 PM Patient is afebrile, vital signs stable, UA is unremarkable, mild decrease in her hemoglobin at 10.7 without any active bleeding. Normal BNP which makes CHF less likely, renal  function is within normal range. Chest x-ray shows no acute finding and no evidence of pleural effusion. At this time I have low suspicion for kidney disease or CHF causing her symptoms. Patient agrees to follow-up with her PCP for further evaluation. Return precautions discussed.   Labs Review Labs Reviewed  CBC - Abnormal; Notable for the following:    Hemoglobin 10.7 (*)    HCT 33.7 (*)    MCH 25.7 (*)    All other components within normal limits  COMPREHENSIVE METABOLIC PANEL - Abnormal; Notable for the following:    Glucose, Bld 136 (*)    Albumin 3.4 (*)    All other components within normal limits  BRAIN NATRIURETIC PEPTIDE  URINALYSIS, ROUTINE W REFLEX MICROSCOPIC    Imaging Review Dg Chest 2 View  05/07/2014   CLINICAL DATA:  Lateral pedal edema for 2 weeks. Difficulty breathing  EXAM: CHEST  2 VIEW  COMPARISON:  March 26, 2007  FINDINGS: Lungs are clear. Heart is mildly enlarged with pulmonary vascularity within normal limits. No adenopathy. No bone lesions.  IMPRESSION: Mild cardiac prominence.  No edema or consolidation.   Electronically Signed   By: Lowella Grip III M.D.   On: 05/07/2014 09:58     EKG Interpretation None      MDM   Final diagnoses:  Peripheral edema    BP 124/77 mmHg  Pulse 60  Temp(Src) 97.9 F (36.6 C) (Oral)  Resp 18  SpO2 98%  I have reviewed nursing notes and vital signs. I personally reviewed the imaging tests through PACS system  I reviewed available ER/hospitalization records thought the EMR     Domenic Moras, PA-C 05/07/14 1221  Arbie Cookey, MD 05/13/14 [phone]

## 2014-09-10 ENCOUNTER — Other Ambulatory Visit: Payer: Self-pay

## 2014-09-10 DIAGNOSIS — Z1231 Encounter for screening mammogram for malignant neoplasm of breast: Secondary | ICD-10-CM

## 2014-09-16 ENCOUNTER — Ambulatory Visit
Admission: RE | Admit: 2014-09-16 | Discharge: 2014-09-16 | Disposition: A | Discharge status: Home / Self Care | Payer: Managed Care, Other (non HMO) | Source: Ambulatory Visit

## 2014-09-16 DIAGNOSIS — Z1231 Encounter for screening mammogram for malignant neoplasm of breast: Secondary | ICD-10-CM

## 2014-12-28 ENCOUNTER — Emergency Department (HOSPITAL_COMMUNITY)
Admission: EM | Admit: 2014-12-28 | Discharge: 2014-12-28 | Disposition: A | Discharge status: Home / Self Care | Payer: Managed Care, Other (non HMO)

## 2014-12-28 NOTE — ED Notes (Signed)
Pt called 3 x for triage. No answer

## 2015-08-24 ENCOUNTER — Other Ambulatory Visit: Payer: Self-pay

## 2015-08-24 ENCOUNTER — Other Ambulatory Visit: Payer: Self-pay | Admitting: Cardiology

## 2015-08-24 DIAGNOSIS — Z1231 Encounter for screening mammogram for malignant neoplasm of breast: Secondary | ICD-10-CM

## 2015-09-17 ENCOUNTER — Ambulatory Visit
Admission: RE | Admit: 2015-09-17 | Discharge: 2015-09-17 | Disposition: A | Discharge status: Home / Self Care | Payer: Managed Care, Other (non HMO) | Source: Ambulatory Visit | Attending: Cardiology | Admitting: Cardiology

## 2015-09-17 DIAGNOSIS — Z1231 Encounter for screening mammogram for malignant neoplasm of breast: Secondary | ICD-10-CM

## 2015-11-08 ENCOUNTER — Other Ambulatory Visit: Payer: Self-pay | Admitting: Cardiology

## 2015-11-08 DIAGNOSIS — N63 Unspecified lump in unspecified breast: Secondary | ICD-10-CM

## 2015-11-12 ENCOUNTER — Ambulatory Visit
Admission: RE | Admit: 2015-11-12 | Discharge: 2015-11-12 | Disposition: A | Discharge status: Home / Self Care | Payer: Managed Care, Other (non HMO) | Source: Ambulatory Visit | Attending: Cardiology | Admitting: Cardiology

## 2015-11-12 ENCOUNTER — Other Ambulatory Visit: Payer: Self-pay | Admitting: Cardiology

## 2015-11-12 DIAGNOSIS — N63 Unspecified lump in unspecified breast: Secondary | ICD-10-CM

## 2015-11-15 ENCOUNTER — Other Ambulatory Visit: Payer: Self-pay | Admitting: Cardiology

## 2015-11-15 DIAGNOSIS — N63 Unspecified lump in unspecified breast: Secondary | ICD-10-CM

## 2015-11-16 ENCOUNTER — Ambulatory Visit
Admission: RE | Admit: 2015-11-16 | Discharge: 2015-11-16 | Disposition: A | Discharge status: Home / Self Care | Payer: Managed Care, Other (non HMO) | Source: Ambulatory Visit | Attending: Cardiology | Admitting: Cardiology

## 2015-11-16 DIAGNOSIS — N63 Unspecified lump in unspecified breast: Secondary | ICD-10-CM

## 2016-07-31 ENCOUNTER — Ambulatory Visit (HOSPITAL_COMMUNITY)
Admission: EM | Admit: 2016-07-31 | Discharge: 2016-07-31 | Disposition: A | Discharge status: Home / Self Care | Payer: Managed Care, Other (non HMO) | Attending: Internal Medicine | Admitting: Internal Medicine

## 2016-07-31 ENCOUNTER — Encounter (HOSPITAL_COMMUNITY): Payer: Self-pay | Admitting: Emergency Medicine

## 2016-07-31 DIAGNOSIS — K118 Other diseases of salivary glands: Secondary | ICD-10-CM | POA: Diagnosis not present

## 2016-07-31 MED ORDER — CLINDAMYCIN HCL 150 MG PO CAPS: 150.0000 mg | ORAL_CAPSULE | Freq: Four times a day (QID) | ORAL | 0 refills | Status: DC

## 2016-07-31 NOTE — ED Triage Notes (Signed)
The patient presented to the [facility] with a complaint of mouth pain x 1 week that has evolved into a sore throat and ear pain on the left side.

## 2016-07-31 NOTE — ED Provider Notes (Signed)
CSN: [id number]     Arrival date & time 07/31/16  1001 History   None    Chief Complaint  Patient presents with  . Oral Swelling   (Consider location/radiation/quality/duration/timing/severity/associated sxs/prior Treatment) The history is provided by the patient. No language interpreter was used.  Mouth Lesions  Location:  Buccal mucosa Buccal mucosa location:  L buccal mucosa Quality:  Unable to specify Onset quality:  Gradual Duration:  1 week Progression:  Worsening Pt complains of swelling on the left side of her mouth.  Pt reports worse swelling when eating.  No problem with drinking water.  No cough or runny nose.    Past Medical History:  Diagnosis Date  . Hypertension   . Renal disorder    early stage kidney disease   Past Surgical History:  Procedure Laterality Date  . ABDOMINAL HYSTERECTOMY    . CARPAL TUNNEL RELEASE    . HAMMER TOE SURGERY     History reviewed. No pertinent family history. Social History  Substance Use Topics  . Smoking status: Former Smoker    Quit date: 05/07/2012  . Smokeless tobacco: Not on file  . Alcohol use Yes     Comment: socially   OB History    No data available     Review of Systems  HENT: Positive for mouth sores.   All other systems reviewed and are negative.   Allergies  Penicillins and Penicillins  Home Medications   Prior to Admission medications   Medication Sig Start Date End Date Taking? Authorizing Provider  metoprolol (LOPRESSOR) 50 MG tablet Take 50 mg by mouth daily. 03/07/14  Yes [provider]  pravastatin (PRAVACHOL) 20 MG tablet Take 20 mg by mouth daily. 03/07/14  Yes [provider]  valsartan-hydrochlorothiazide (DIOVAN HCT) 320-12.5 MG per tablet Take 1 tablet by mouth daily.   Yes [provider]   Meds Ordered and Administered this Visit  Medications - No data to display  There were no vitals taken for this visit. No data found.   Physical Exam  Constitutional:  She appears well-developed and well-nourished.  HENT:  Right Ear: External ear normal.  Left Ear: External ear normal.  Tender salivary glands left inner mouth,  Tender lymph nodes left chin.    Eyes: Conjunctivae and EOM are normal. Pupils are equal, round, and reactive to light.  Neck: Normal range of motion.  Cardiovascular: Normal rate and regular rhythm.   Pulmonary/Chest: Effort normal.  Musculoskeletal: Normal range of motion.  Neurological: She is alert.  Skin: Skin is warm.  Psychiatric: She has a normal mood and affect.  Nursing note and vitals reviewed.   Urgent Care Course     Procedures (including critical care time)  Labs Review Labs Reviewed - No data to display  Imaging Review No results found.   Visual Acuity Review  Right Eye Distance:   Left Eye Distance:   Bilateral Distance:    Right Eye Near:   Left Eye Near:    Bilateral Near:         MDM   1. Salivary duct obstruction    An After Visit Summary was printed and given to the patient. Current Meds  Medication Sig  . metoprolol (LOPRESSOR) 50 MG tablet Take 50 mg by mouth daily.  . pravastatin (PRAVACHOL) 20 MG tablet Take 20 mg by mouth daily.  . valsartan-hydrochlorothiazide (DIOVAN HCT) 320-12.5 MG per tablet Take 1 tablet by mouth daily.   Meds ordered this encounter  Medications  .  clindamycin (CLEOCIN) 150 MG capsule    Sig: Take 1 capsule (150 mg total) by mouth every 6 (six) hours.    Dispense:  28 capsule    Refill:  0    Order Specific Question:   Supervising Provider    Answer:   Sherlene Shams [[id number]]  lemon drop Follow up with Dr. Mancel Parsons. An After Visit Summary was printed and given to the patient.   Fransico Meadow, [place] 07/31/16 1104

## 2016-08-25 ENCOUNTER — Other Ambulatory Visit: Payer: Self-pay | Admitting: Cardiology

## 2016-08-25 DIAGNOSIS — Z1231 Encounter for screening mammogram for malignant neoplasm of breast: Secondary | ICD-10-CM

## 2016-11-13 ENCOUNTER — Ambulatory Visit: Payer: Managed Care, Other (non HMO)

## 2016-11-29 ENCOUNTER — Ambulatory Visit: Payer: Managed Care, Other (non HMO)

## 2016-11-29 ENCOUNTER — Ambulatory Visit
Admission: RE | Admit: 2016-11-29 | Discharge: 2016-11-29 | Disposition: A | Discharge status: Home / Self Care | Payer: Managed Care, Other (non HMO) | Source: Ambulatory Visit | Attending: Cardiology | Admitting: Cardiology

## 2016-11-29 DIAGNOSIS — Z1231 Encounter for screening mammogram for malignant neoplasm of breast: Secondary | ICD-10-CM

## 2017-06-25 ENCOUNTER — Ambulatory Visit (INDEPENDENT_AMBULATORY_CARE_PROVIDER_SITE_OTHER): Payer: Managed Care, Other (non HMO)

## 2017-06-25 ENCOUNTER — Ambulatory Visit: Payer: Managed Care, Other (non HMO) | Admitting: Physician Assistant

## 2017-06-25 ENCOUNTER — Other Ambulatory Visit: Payer: Self-pay

## 2017-06-25 ENCOUNTER — Encounter: Payer: Self-pay | Admitting: Physician Assistant

## 2017-06-25 VITALS — BP 144/88 | HR 60 | Temp 98.4°F | Resp 16 | Ht 68.0 in | Wt 226.0 lb

## 2017-06-25 DIAGNOSIS — M25551 Pain in right hip: Secondary | ICD-10-CM

## 2017-06-25 DIAGNOSIS — M542 Cervicalgia: Secondary | ICD-10-CM

## 2017-06-25 DIAGNOSIS — I10 Essential (primary) hypertension: Secondary | ICD-10-CM | POA: Diagnosis not present

## 2017-06-25 DIAGNOSIS — G8929 Other chronic pain: Secondary | ICD-10-CM

## 2017-06-25 DIAGNOSIS — M79672 Pain in left foot: Secondary | ICD-10-CM

## 2017-06-25 NOTE — Patient Instructions (Addendum)
  Please continue the metoprolol for now.  I have labs out with regard to your hypertension and would like to see you back on Friday so we can formulate a plan that we will work.  Please avoid taking meloxicam for this time.  It is okay to continue taking your metoprolol.  I placed a referral for podiatry  given your history of chronic foot pain.  Your x-rays show arthritis in the right hip slightly worse than the left side.  Your neck shows osteoarthritic changes as well.  None of the radiographs show severe arthritis.  For now, as far as pain goes please take 1000 mg of Tylenol every 8 hours.  This will not negatively impact your blood pressure.  I look forward to seeing you back on Friday so we can work on hypertension and optimize your medications for pain.  Please do not take anything else for pain at this time.   IF you received an x-ray today, you will receive an invoice from [facility]. Please contact [facility] at [phone] with questions or concerns regarding your invoice.   IF you received labwork today, you will receive an invoice from [facility]. Please contact [facility] at [phone] with questions or concerns regarding your invoice.   Our billing staff will not be able to assist you with questions regarding bills from these companies.  You will be contacted with the lab results as soon as they are available. The fastest way to get your results is to activate your My Chart account. Instructions are located on the last page of this paperwork. If you have not heard from [place] regarding the results in 2 weeks, please contact this office.

## 2017-06-25 NOTE — Progress Notes (Signed)
06/25/2017 11:47 AM   DOB: 04-01-1970 / MRN: [id number]  SUBJECTIVE:  Veronica Pollard is a 47 y.o. female presenting for chronic pain. She would like referral to podiatry.  Has a history of surgical intervention on the left lateral 5th MTP.  Tells me that pain has been worsening over the last few months and also shoots up her back.   She complains of a "ruptured disk" in her neck and points to her right sided neck.  This pain radiates to the left arm and back. She has tried ibuprofen 400 qid sporadically. Denies GERD and PUD. Has some blood in the stool however this is due to hemorrhoids.  She has history of hypertension and takes medications sporadically.  Last creatinine was normal.    She is allergic to penicillins and penicillins.   She  has a past medical history of Hypertension and Renal disorder.    She  reports that she quit smoking about 5 years ago. She does not have any smokeless tobacco history on file. She reports that she drinks alcohol. She reports that she does not use drugs. She  has no sexual activity history on file. The patient  has a past surgical history that includes Abdominal hysterectomy; Carpal tunnel release; Hammer toe surgery; and Breast biopsy (Right, 2014).  Her family history is not on file.  Review of Systems  Constitutional: Negative for chills, diaphoresis and fever.  Eyes: Negative.   Respiratory: Negative for cough, hemoptysis, sputum production, shortness of breath and wheezing.   Cardiovascular: Negative for chest pain, orthopnea and leg swelling.  Gastrointestinal: Negative for abdominal pain, blood in stool, constipation, diarrhea, heartburn, melena, nausea and vomiting.  Genitourinary: Negative for flank pain.  Skin: Negative for rash.  Neurological: Negative for dizziness, sensory change, speech change, focal weakness and headaches.    The problem list and medications were reviewed and updated by myself where necessary and exist elsewhere in  the encounter.   OBJECTIVE:  BP (!) 144/88   Pulse 60   Temp 98.4 F (36.9 C) (Oral)   Resp 16   Ht 5\' 8"  (1.727 m)   Wt 226 lb (102.5 kg)   SpO2 96%   BMI 34.36 kg/m     Physical Exam  Constitutional: She is active.  Non-toxic appearance.  Cardiovascular: Normal rate, regular rhythm, S1 normal, S2 normal, normal heart sounds and intact distal pulses. Exam reveals no gallop, no friction rub and no decreased pulses.  No murmur heard. Pulmonary/Chest: Effort normal. No stridor. No tachypnea. No respiratory distress. She has no wheezes. She has no rales.  Abdominal: She exhibits no distension.  Musculoskeletal: She exhibits no edema.  Cervical spine range of motion full.  Strength equal bilaterally in the upper extremities and reflexes 2+ bilaterally.  Negative for decreased sensation to light touch about the cervical dermatomes.  Negative for rash about the neck.  Negative for step-off deformity about the spine.  Right hip acetabular grind negative.  Negative Faber and negative FADIR.  Neurological: She is alert.  Skin: Skin is warm and dry. She is not diaphoretic. No pallor.    Lab Results  Component Value Date   CREATININE 0.78 05/07/2014   BUN 10 05/07/2014   NA 136 05/07/2014   K 4.1 05/07/2014   CL 102 05/07/2014   CO2 27 05/07/2014     No results found for this or any previous visit (from the past 52 hour(s)).  Dg Cervical Spine 2 Or 3 Views  Result  Date: 06/25/2017 CLINICAL DATA:  Chronic neck pain EXAM: CERVICAL SPINE - 2-3 VIEW COMPARISON:  None. FINDINGS: Degenerative spurring anteriorly in the mid and lower cervical spine. Disc spaces are maintained. Normal alignment. Prevertebral soft tissues are normal. No fracture. IMPRESSION: Early anterior degenerative spurring in the mid and lower cervical spine. No acute bony abnormality. Electronically Signed   By: Rolm Baptise M.D.   On: 06/25/2017 11:31   Dg Hip Unilat W Or W/o Pelvis 2-3 Views Right  Result Date:  06/25/2017 CLINICAL DATA:  Chronic pain EXAM: DG HIP (WITH OR WITHOUT PELVIS) 2-3V RIGHT COMPARISON:  None. FINDINGS: Frontal pelvis as well as frontal and lateral right hip images were obtained. There is moderate narrowing of both hip joints, minimally more severe on the right than on the left. No fracture or dislocation. No erosive change. Sacroiliac joints bilaterally appear normal. IMPRESSION: Osteoarthritic change in both hip joints, slightly more severe on the right than on the left. No fracture or dislocation. Electronically Signed   By: Lowella Grip III M.D.   On: 06/25/2017 11:32    ASSESSMENT AND PLAN:  [name] was seen today for establish care, foot pain and referral.  Diagnoses and all orders for this visit:  Uncontrolled hypertension -     Renal Function Panel -     CBC -     TSH  Chronic foot pain, left -     Ambulatory referral to [facility] order/instruction:  Chronic right hip pain -     DG HIP UNILAT W OR W/O PELVIS 2-3 VIEWS RIGHT; Future  Chronic neck pain -     DG Cervical Spine 2 or 3 views; Future    The patient is advised to call or return to clinic if she does not see an improvement in symptoms, or to seek the care of the closest emergency department if she worsens with the above plan.   Philis Fendt, MHS, PA-C [facility] 06/25/2017 11:47 AM

## 2017-06-26 LAB — RENAL FUNCTION PANEL
ALBUMIN: 4 g/dL (ref 3.5–5.5)
BUN/Creatinine Ratio: 12 (ref 9–23)
BUN: 10 mg/dL (ref 6–24)
CO2: 24 mmol/L (ref 20–29)
Calcium: 9.1 mg/dL (ref 8.7–10.2)
Chloride: 101 mmol/L (ref 96–106)
Creatinine, Ser: 0.86 mg/dL (ref 0.57–1.00)
GFR calc Af Amer: 93 mL/min/{1.73_m2} (ref >59)
GFR calc non Af Amer: 81 mL/min/{1.73_m2} (ref >59)
GLUCOSE: 94 mg/dL (ref 65–99)
Phosphorus: 4 mg/dL (ref 2.5–4.5)
Potassium: 3.8 mmol/L (ref 3.5–5.2)
Sodium: 141 mmol/L (ref 134–144)

## 2017-06-26 LAB — TSH: TSH: 0.681 u[IU]/mL (ref 0.450–4.500)

## 2017-06-26 LAB — CBC
HEMOGLOBIN: 12 g/dL (ref 11.1–15.9)
Hematocrit: 36.1 % (ref 34.0–46.6)
MCH: 26.8 pg (ref 26.6–33.0)
MCHC: 33.2 g/dL (ref 31.5–35.7)
MCV: 81 fL (ref 79–97)
Platelets: 235 10*3/uL (ref 150–379)
RBC: 4.47 x10E6/uL (ref 3.77–5.28)
RDW: 15.7 % — ABNORMAL HIGH (ref 12.3–15.4)
WBC: 7.1 10*3/uL (ref 3.4–10.8)

## 2017-06-29 ENCOUNTER — Ambulatory Visit: Payer: Managed Care, Other (non HMO) | Admitting: Podiatry

## 2017-06-29 ENCOUNTER — Other Ambulatory Visit: Payer: Self-pay

## 2017-06-29 ENCOUNTER — Ambulatory Visit (INDEPENDENT_AMBULATORY_CARE_PROVIDER_SITE_OTHER): Payer: Managed Care, Other (non HMO)

## 2017-06-29 ENCOUNTER — Encounter: Payer: Self-pay | Admitting: Physician Assistant

## 2017-06-29 ENCOUNTER — Ambulatory Visit (INDEPENDENT_AMBULATORY_CARE_PROVIDER_SITE_OTHER): Payer: Managed Care, Other (non HMO) | Admitting: Physician Assistant

## 2017-06-29 VITALS — BP 132/80 | HR 78 | Temp 98.0°F | Resp 16 | Ht 68.11 in | Wt 229.0 lb

## 2017-06-29 DIAGNOSIS — M779 Enthesopathy, unspecified: Secondary | ICD-10-CM | POA: Diagnosis not present

## 2017-06-29 DIAGNOSIS — M5030 Other cervical disc degeneration, unspecified cervical region: Secondary | ICD-10-CM | POA: Diagnosis not present

## 2017-06-29 DIAGNOSIS — R030 Elevated blood-pressure reading, without diagnosis of hypertension: Secondary | ICD-10-CM

## 2017-06-29 DIAGNOSIS — G8929 Other chronic pain: Secondary | ICD-10-CM | POA: Diagnosis not present

## 2017-06-29 DIAGNOSIS — M79672 Pain in left foot: Principal | ICD-10-CM

## 2017-06-29 DIAGNOSIS — M160 Bilateral primary osteoarthritis of hip: Secondary | ICD-10-CM

## 2017-06-29 DIAGNOSIS — M21622 Bunionette of left foot: Secondary | ICD-10-CM | POA: Diagnosis not present

## 2017-06-29 MED ORDER — PRAVASTATIN SODIUM 20 MG PO TABS: 20.0000 mg | ORAL_TABLET | Freq: Every day | ORAL | 3 refills | Status: DC

## 2017-06-29 MED ORDER — METOPROLOL TARTRATE 50 MG PO TABS: 50.0000 mg | ORAL_TABLET | Freq: Every day | ORAL | 3 refills | Status: DC

## 2017-06-29 NOTE — Patient Instructions (Addendum)
Come back in three months for annual exam.     IF you received an x-ray today, you will receive an invoice from [facility]. Please contact [facility] at [phone] with questions or concerns regarding your invoice.   IF you received labwork today, you will receive an invoice from [facility]. Please contact [facility] at [phone] with questions or concerns regarding your invoice.   Our billing staff will not be able to assist you with questions regarding bills from these companies.  You will be contacted with the lab results as soon as they are available. The fastest way to get your results is to activate your My Chart account. Instructions are located on the last page of this paperwork. If you have not heard from [place] regarding the results in 2 weeks, please contact this office.

## 2017-06-29 NOTE — Progress Notes (Signed)
    06/29/2017 10:28 AM   DOB: 1970/08/06 / MRN: [id number]  SUBJECTIVE:  Veronica Pollard is a 47 y.o. female presenting for   She is allergic to penicillins and penicillins.   She  has a past medical history of Hypertension and Renal disorder.    She  reports that she quit smoking about 5 years ago. She has never used smokeless tobacco. She reports that she drinks alcohol. She reports that she does not use drugs. She  has no sexual activity history on file. The patient  has a past surgical history that includes Abdominal hysterectomy; Carpal tunnel release; Hammer toe surgery; and Breast biopsy (Right, 2014).  Her family history is not on file.  ROS  The problem list and medications were reviewed and updated by myself where necessary and exist elsewhere in the encounter.   OBJECTIVE:  BP 132/80   Pulse 78   Temp 98 F (36.7 C) (Oral)   Resp 16   Ht 5' 8.11" (1.73 m)   Wt 229 lb (103.9 kg)   SpO2 98%   BMI 34.71 kg/m   Physical Exam  No results found for this or any previous visit (from the past 72 hour(s)).  No results found.  ASSESSMENT AND PLAN:  [place] was seen today for hypertension.  Diagnoses and all orders for this visit:  Primary osteoarthritis of both hips -     Ambulatory referral to Physical Therapy  DDD (degenerative disc disease), cervical -     Ambulatory referral to Physical Therapy  Prehypertension  Other orders -     metoprolol tartrate (LOPRESSOR) 50 MG tablet; Take 1 tablet (50 mg total) by mouth daily. -     pravastatin (PRAVACHOL) 20 MG tablet; Take 1 tablet (20 mg total) by mouth daily.    The patient is advised to call or return to clinic if she does not see an improvement in symptoms, or to seek the care of the closest emergency department if she worsens with the above plan.   Philis Fendt, MHS, PA-C [facility] 06/29/2017 10:28 AM

## 2017-06-29 NOTE — Progress Notes (Signed)
06/29/2017 10:33 AM   DOB: October 04, 1970 / MRN: [id number]  SUBJECTIVE:  Veronica Pollard is a 47 y.o. female presenting for follow-up of elevated blood pressure.  She did not take meloxicam today.  She has taken her metoprolol today.  She is also here for follow-up of her hip.  She likes to exercise on most days of the week and tries to burn about 1000 cal via physical activity which includes walking.  The hip does interfere with her daily activities however she tells me exercise continues to improve the pain acutely.  She tried taking Tylenol and felt that this made her pain worse.  She was unaware that meloxicam can increase her blood pressure.  Previous labs do indicate hyperlipidemia.  She does take 20 mg of pravastatin at this time.  Of advised that she continue this.  Given the options discussed today with regard to osteoarthritis in the neck and hips she wants to hold off on orthopedic referral at this time.  She also does not want to take steroid injections or p.o. and is worked hard to keep the weight off and does not want to revert back she weighed 400 pounds at one time.  I tried to advise her that some steroids may be worth the risk.  Advised that she take 7.5 mg meloxicam along with 1000 mg Tylenol every 8 hours as needed.  Advised that she monitor her blood pressure with a cough for the next 7 days and then report these numbers to me via my chart.  She is allergic to penicillins and penicillins.   She  has a past medical history of Hypertension and Renal disorder.    She  reports that she quit smoking about 5 years ago. She has never used smokeless tobacco. She reports that she drinks alcohol. She reports that she does not use drugs. She  has no sexual activity history on file. The patient  has a past surgical history that includes Abdominal hysterectomy; Carpal tunnel release; Hammer toe surgery; and Breast biopsy (Right, 2014).  Her family history is not on file.  Review of Systems    Respiratory: Negative for cough.   Cardiovascular: Negative for chest pain and leg swelling.  Genitourinary: Negative for dysuria.  Musculoskeletal: Positive for myalgias.  Skin: Negative for rash.  Neurological: Negative for dizziness.    The problem list and medications were reviewed and updated by myself where necessary and exist elsewhere in the encounter.   OBJECTIVE:  BP 132/80   Pulse 78   Temp 98 F (36.7 C) (Oral)   Resp 16   Ht 5' 8.11" (1.73 m)   Wt 229 lb (103.9 kg)   SpO2 98%   BMI 34.71 kg/m   Lab Results  Component Value Date   WBC 7.1 06/25/2017   HGB 12.0 06/25/2017   HCT 36.1 06/25/2017   MCV 81 06/25/2017   PLT 235 06/25/2017    Lab Results  Component Value Date   CREATININE 0.86 06/25/2017   BUN 10 06/25/2017   NA 141 06/25/2017   K 3.8 06/25/2017   CL 101 06/25/2017   CO2 24 06/25/2017   Lab Results  Component Value Date   ALT 22 05/07/2014   AST 24 05/07/2014   ALKPHOS 60 05/07/2014   BILITOT 0.7 05/07/2014   Lab Results  Component Value Date   TSH 0.681 06/25/2017   Lab Results  Component Value Date   CHOL 218 (H) 05/12/2010   HDL 46 05/12/2010  LDLCALC 156 (H) 05/12/2010   TRIG 79 05/12/2010   CHOLHDL 4.7 Ratio 05/12/2010     Physical Exam  Constitutional: She is oriented to person, place, and time. She appears well-developed.  Eyes: Pupils are equal, round, and reactive to light. EOM are normal.  Cardiovascular: Normal rate, regular rhythm, S1 normal, S2 normal, normal heart sounds and intact distal pulses. Exam reveals no gallop, no friction rub and no decreased pulses.  No murmur heard. Pulmonary/Chest: Effort normal. She has no rales.  Abdominal: She exhibits no distension.  Musculoskeletal: Normal range of motion. She exhibits no edema.  Neurological: She is alert and oriented to person, place, and time. No cranial nerve deficit.  Skin: Skin is warm and dry. She is not diaphoretic.  Psychiatric: She has a normal  mood and affect.  Vitals reviewed.   No results found for this or any previous visit (from the past 72 hour(s)).  No results found.  ASSESSMENT AND PLAN:  Trace was seen today for hypertension.  Diagnoses and all orders for this visit:  Primary osteoarthritis of both hips: discussion per HPI. -     Ambulatory referral to Physical Therapy  DDD (degenerative disc disease), cervical: Discussion per HPI -     Ambulatory referral to Physical Therapy  Prehypertension: She will monitor her BP per HPI.  Elevations at her previous visit most likely secondary to 15 meloxicam.  RTC in 3 months for annual physical.  Other orders -     metoprolol tartrate (LOPRESSOR) 50 MG tablet; Take 1 tablet (50 mg total) by mouth daily. -     pravastatin (PRAVACHOL) 20 MG tablet; Take 1 tablet (20 mg total) by mouth daily.    The patient is advised to call or return to clinic if she does not see an improvement in symptoms, or to seek the care of the closest emergency department if she worsens with the above plan.   Philis Fendt, MHS, PA-C [facility] 06/29/2017 10:33 AM

## 2017-06-29 NOTE — Patient Instructions (Signed)
Pre-Operative Instructions  Congratulations, you have decided to take an important step towards improving your quality of life.  You can be assured that the doctors and staff at [facility] will be with you every step of the way.  Here are some important things you should know:  1. Plan to be at the surgery center/hospital at least 1 (one) hour prior to your scheduled time, unless otherwise directed by the surgical center/hospital staff.  You must have a responsible adult accompany you, remain during the surgery and drive you home.  Make sure you have directions to the surgical center/hospital to ensure you arrive on time. 2. If you are having surgery at [facility] or [facility] hospitals, you will need a copy of your medical history and physical form from your family physician within one month prior to the date of surgery. We will give you a form for your primary physician to complete.  3. We make every effort to accommodate the date you request for surgery.  However, there are times where surgery dates or times have to be moved.  We will contact you as soon as possible if a change in schedule is required.   4. No aspirin/ibuprofen for one week before surgery.  If you are on aspirin, any non-steroidal anti-inflammatory medications (Mobic, Aleve, Ibuprofen) should not be taken seven (7) days prior to your surgery.  You make take Tylenol for pain prior to surgery.  5. Medications - If you are taking daily heart and blood pressure medications, seizure, reflux, allergy, asthma, anxiety, pain or diabetes medications, make sure you notify the surgery center/hospital before the day of surgery so they can tell you which medications you should take or avoid the day of surgery. 6. No food or drink after midnight the night before surgery unless directed otherwise by surgical center/hospital staff. 7. No alcoholic beverages 24-hours prior to surgery.  No smoking 24-hours prior or 24-hours after  surgery. 8. Wear loose pants or shorts. They should be loose enough to fit over bandages, boots, and casts. 9. Don't wear slip-on shoes. Sneakers are preferred. 10. Bring your boot with you to the surgery center/hospital.  Also bring crutches or a walker if your physician has prescribed it for you.  If you do not have this equipment, it will be provided for you after surgery. 11. If you have not been contacted by the surgery center/hospital by the day before your surgery, call to confirm the date and time of your surgery. 12. Leave-time from work may vary depending on the type of surgery you have.  Appropriate arrangements should be made prior to surgery with your employer. 13. Prescriptions will be provided immediately following surgery by your doctor.  Fill these as soon as possible after surgery and take the medication as directed. Pain medications will not be refilled on weekends and must be approved by the doctor. 14. Remove nail polish on the operative foot and avoid getting pedicures prior to surgery. 15. Wash the night before surgery.  The night before surgery wash the foot and leg well with water and the antibacterial soap provided. Be sure to pay special attention to beneath the toenails and in between the toes.  Wash for at least three (3) minutes. Rinse thoroughly with water and dry well with a towel.  Perform this wash unless told not to do so by your physician.  Enclosed: 1 Ice pack (please put in freezer the night before surgery)   1 Hibiclens skin cleaner     Pre-op instructions  If you have any questions regarding the instructions, please do not hesitate to call our office.  [place]: [address], [place], [place] [address] -- [phone]  [place]: [address] [place], [place] [address] -- [phone]  [place]: [address]  , [place] [address] -- [phone]  [place]: [address], [address], [place], [place] [address] -- [phone]  Website:  [email or web address] 

## 2017-07-01 NOTE — Progress Notes (Signed)
  Subjective:  Patient ID: Veronica Pollard, female    DOB: 02-13-1971,  MRN: [id number]  Chief Complaint  Patient presents with  . Toe Pain    boney growth left 5th toe - very painful at time - sharp, spasm like pain    46 y.o. female presents with the above complaint.  Reports pain to the left fifth toe.  Reports bony growth that was removed years ago but is come back.  Reports pain while wearing her shoes better with open toed shoes. B capsulitis tailor's bunion left elieve that she had a tailor's bunionectomy performed. Reports sharp pain and spasms of the area.  Would like to discuss possible correction.  Reports pain with all her shoe gear.  Past Medical History:  Diagnosis Date  . Hypertension   . Renal disorder    early stage kidney disease   Past Surgical History:  Procedure Laterality Date  . ABDOMINAL HYSTERECTOMY    . BREAST BIOPSY Right 2014   benign  . CARPAL TUNNEL RELEASE    . HAMMER TOE SURGERY      Current Outpatient Medications:  .  meloxicam (MOBIC) 7.5 MG tablet, Take 7.5 mg by mouth daily., Disp: , Rfl:  .  metoprolol tartrate (LOPRESSOR) 50 MG tablet, Take 1 tablet (50 mg total) by mouth daily., Disp: 180 tablet, Rfl: 3 .  pravastatin (PRAVACHOL) 20 MG tablet, Take 1 tablet (20 mg total) by mouth daily., Disp: 90 tablet, Rfl: 3  Allergies  Allergen Reactions  . Penicillins     REACTION: Swollen throat, difficult breathing  . Penicillins    Review of Systems: Negative except as noted in the HPI. Denies N/V/F/Ch. Objective:  There were no vitals filed for this visit. General AA&O x3. Normal mood and affect.  Vascular Dorsalis pedis and posterior tibial pulses  present 2+ bilaterally  Capillary refill normal to all digits. Pedal hair growth normal.  Neurologic Epicritic sensation grossly present.  Dermatologic No open lesions. Interspaces clear of maceration. Nails well groomed and normal in appearance. Hard callused sub-met 5 left with porokeratosis    Orthopedic: MMT 5/5 in dorsiflexion, plantarflexion, inversion, and eversion. Normal joint ROM without pain or crepitus.  Tailor's bunion left fifth MPJ with pain to palpation   Assessment & Plan:  Patient was evaluated and treated and all questions answered.  Capsulitis tailor's bunion left -Injection delivered to the left fifth MPJ capsule as below -X-rays taken reviewed evidence of prior tailor's bunionectomy.  Healed. -Discussed the patient that due to continued pain from the tailor's bunion at this point would consider surgical excision of the fifth metatarsal head which should alleviate the callus formation in the pain.  Patient understands all response alternatives discussed.  Patient wishes to proceed.  Consent reviewed and signed by patient.  We will proceed to date from by surgical scheduler  Procedure: Joint Injection Location: Left 5th MPJ joint Skin Prep: Alcohol. Injectate: 0.5 cc 1% lidocaine plain, 0.5 cc dexamethasone phosphate. Disposition: Patient tolerated procedure well. Injection site dressed with a band-aid.  No follow-ups on file.

## 2017-07-02 ENCOUNTER — Telehealth: Payer: Self-pay | Admitting: *Deleted

## 2017-07-02 NOTE — Telephone Encounter (Signed)
"  Please give me a call about surgery information."  I am returning your call.  How can I help you?  "I was trying to do the paperwork on-line.  I just kept getting frustrated.  I kept trying but I could not get it to work right.  I get frustrated with computers."  Someone from the surgical center will call you to get the information they need.  "Okay good, I appreciate you calling me back."

## 2017-07-09 ENCOUNTER — Telehealth: Payer: Self-pay | Admitting: Physician Assistant

## 2017-07-09 NOTE — Telephone Encounter (Signed)
Advised pt to contact their surgeon.

## 2017-07-09 NOTE — Telephone Encounter (Unsigned)
Copied from [place] [id number]. Topic: Quick Communication - See Telephone Encounter >> Jul 09, 2017 10:54 AM Neva Seat wrote: Pt is needing an approval after surgery on Wed. 24th for neck and back therapy at the rehabilitation center.

## 2017-07-11 ENCOUNTER — Other Ambulatory Visit: Payer: Self-pay | Admitting: Podiatry

## 2017-07-11 ENCOUNTER — Ambulatory Visit: Payer: Managed Care, Other (non HMO) | Admitting: Physical Therapy

## 2017-07-11 DIAGNOSIS — M2012 Hallux valgus (acquired), left foot: Secondary | ICD-10-CM | POA: Diagnosis not present

## 2017-07-11 DIAGNOSIS — M7752 Other enthesopathy of left foot: Secondary | ICD-10-CM | POA: Diagnosis not present

## 2017-07-11 MED ORDER — CLINDAMYCIN HCL 300 MG PO CAPS: 300.0000 mg | ORAL_CAPSULE | Freq: Two times a day (BID) | ORAL | 0 refills | Status: DC

## 2017-07-11 MED ORDER — PROMETHAZINE HCL 25 MG PO TABS: 25.0000 mg | ORAL_TABLET | Freq: Three times a day (TID) | ORAL | 0 refills | Status: DC | PRN

## 2017-07-11 MED ORDER — OXYCODONE-ACETAMINOPHEN 10-325 MG PO TABS: 1.0000 | ORAL_TABLET | ORAL | 0 refills | Status: DC | PRN

## 2017-07-11 NOTE — Progress Notes (Signed)
Patient presented for elective foot surgery today. Rx sent to pharmacy.

## 2017-07-12 ENCOUNTER — Encounter: Payer: Self-pay | Admitting: Podiatry

## 2017-07-12 NOTE — Progress Notes (Signed)
Attempted to call patient for post-op check. No answer, VM left.

## 2017-07-13 ENCOUNTER — Ambulatory Visit (INDEPENDENT_AMBULATORY_CARE_PROVIDER_SITE_OTHER): Payer: Managed Care, Other (non HMO) | Admitting: Podiatry

## 2017-07-13 DIAGNOSIS — M21622 Bunionette of left foot: Secondary | ICD-10-CM

## 2017-07-13 DIAGNOSIS — Z9889 Other specified postprocedural states: Secondary | ICD-10-CM

## 2017-07-16 ENCOUNTER — Ambulatory Visit: Payer: Managed Care, Other (non HMO)

## 2017-07-16 NOTE — Progress Notes (Signed)
Subjective: Veronica Pollard is a 47 y.o. is seen today in office s/p left 5th metatarsal head resection preformed on 07/11/2017 with Dr. March Rummage. She states that she is doing well having minimal discomfort.  She is remaining surgical shoe.  She has been taking her postoperative medications as directed denies any systemic complaints such as fevers, chills, nausea, vomiting. No calf pain, chest pain, shortness of breath.   Objective: General: No acute distress, AAOx3  DP/PT pulses palpable 2/4, CRT < 3 sec to all digits.  Protective sensation intact. Motor function intact.  LEFT foot: Incision is well coapted without any evidence of dehiscence and sutures are intact. There is no surrounding erythema, ascending cellulitis, fluctuance, crepitus, malodor, drainage/purulence. There is mild edema around the surgical site. There is minimal pain along the surgical site.  No other areas of tenderness to bilateral lower extremities.  No other open lesions or pre-ulcerative lesions.  No pain with calf compression, swelling, warmth, erythema.   Assessment and Plan:  Status post left fifth metatarsal head resection, doing well with no complications   -Treatment options discussed including all alternatives, risks, and complications -She had postoperative x-rays from surgery already.  -She is doing well.  Antibiotic ointment was applied followed by a bandage.  Keep the dressing clean, dry, intact -Continue surgical shoe -Ice/elevation -Pain medication as needed. -Monitor for any clinical signs or symptoms of infection and DVT/PE and directed to call the office immediately should any occur or go to the ER. -Follow-up 1 week with Dr. March Rummage or sooner if any problems arise. In the meantime, encouraged to call the office with any questions, concerns, change in symptoms.   Celesta Gentile, DPM

## 2017-07-19 ENCOUNTER — Other Ambulatory Visit: Payer: Self-pay | Admitting: Podiatry

## 2017-07-19 DIAGNOSIS — M21622 Bunionette of left foot: Secondary | ICD-10-CM

## 2017-07-20 ENCOUNTER — Ambulatory Visit (INDEPENDENT_AMBULATORY_CARE_PROVIDER_SITE_OTHER): Payer: Managed Care, Other (non HMO) | Admitting: Podiatry

## 2017-07-20 DIAGNOSIS — M21622 Bunionette of left foot: Secondary | ICD-10-CM

## 2017-07-20 DIAGNOSIS — Z9889 Other specified postprocedural states: Secondary | ICD-10-CM

## 2017-07-24 ENCOUNTER — Ambulatory Visit: Payer: Managed Care, Other (non HMO)

## 2017-07-27 ENCOUNTER — Ambulatory Visit (INDEPENDENT_AMBULATORY_CARE_PROVIDER_SITE_OTHER): Payer: Managed Care, Other (non HMO) | Admitting: Podiatry

## 2017-07-27 ENCOUNTER — Encounter: Payer: Self-pay | Admitting: Podiatry

## 2017-07-27 ENCOUNTER — Ambulatory Visit (INDEPENDENT_AMBULATORY_CARE_PROVIDER_SITE_OTHER): Payer: Managed Care, Other (non HMO)

## 2017-07-27 DIAGNOSIS — M21622 Bunionette of left foot: Secondary | ICD-10-CM

## 2017-07-27 DIAGNOSIS — Z9889 Other specified postprocedural states: Secondary | ICD-10-CM

## 2017-07-29 NOTE — Progress Notes (Signed)
  Subjective:  Patient ID: Veronica Pollard, female    DOB: 10/25/1970,  MRN: [id number]  No chief complaint on file.   DOS: 07/11/17 Procedure: L 5th metataral head resection.  47 y.o. female returns for post-op check. Denies N/V/F/Ch. Pain is controlled with current medications. Doing well. Pleased with results so far. Minimal pain.  Objective:   General AA&O x3. Normal mood and affect.  Vascular Foot warm and well perfused.  Neurologic Gross sensation intact.  Dermatologic Skin healing well without signs of infection. Skin edges well coapted without signs of infection.  Orthopedic: Tenderness to palpation noted about the surgical site.    Assessment & Plan:  Patient was evaluated and treated and all questions answered.  S/p L 5th metatarsal head resection -Progressing as expected post-operatively. -Sutures: intact. -Medications refilled: none -Foot redressed.  Return in about 1 week (around 07/27/2017) for Post-op.

## 2017-08-10 ENCOUNTER — Encounter: Payer: Managed Care, Other (non HMO) | Admitting: Podiatry

## 2017-08-17 ENCOUNTER — Ambulatory Visit (INDEPENDENT_AMBULATORY_CARE_PROVIDER_SITE_OTHER): Payer: Managed Care, Other (non HMO) | Admitting: Podiatry

## 2017-08-17 ENCOUNTER — Ambulatory Visit: Payer: Self-pay

## 2017-08-17 DIAGNOSIS — M21622 Bunionette of left foot: Secondary | ICD-10-CM

## 2017-08-17 DIAGNOSIS — L600 Ingrowing nail: Secondary | ICD-10-CM

## 2017-08-17 NOTE — Progress Notes (Signed)
  Subjective:  Patient ID: Veronica Pollard, female    DOB: August 18, 1970,  MRN: [id number]  Chief Complaint  Patient presents with  . Routine Post Op    DOS 07/11/17  tailor's bunion repair left - doing very well, some itching and dry skin    DOS: 07/11/17 Procedure: L 5th metataral head resection.  47 y.o. female returns for post-op check. Denies N/V/F/Ch. Only occasional pain. Walking in normal shoegear without issue. Also complains of L great toenail ingrowing nail.  Objective:   General AA&O x3. Normal mood and affect.  Vascular Foot warm and well perfused.  Neurologic Gross sensation intact.  Dermatologic Skin well healed. R great toenail ingrowing nail both borders but no paronyhcia.  Orthopedic: No tenderness to palpation of surgical site.    Assessment & Plan:  Patient was evaluated and treated and all questions answered.  S/p L 5th metatarsal head resection -Progressing as expected post-operatively. -Skin healed -Continue WBAT in normal shoegear  Ingrown Nail L Great Toe -Return 2 weeks for L Great toe ingrown nail removal. Hold off today to avoid soaking of surgical site.  Return in about 2 weeks (around 08/31/2017) for post op Tailor's bunionectomy Lt.

## 2017-09-07 ENCOUNTER — Encounter: Payer: Managed Care, Other (non HMO) | Admitting: Podiatry

## 2017-09-28 ENCOUNTER — Ambulatory Visit: Payer: Managed Care, Other (non HMO) | Admitting: Physician Assistant

## 2017-10-01 ENCOUNTER — Other Ambulatory Visit: Payer: Self-pay

## 2017-10-01 ENCOUNTER — Ambulatory Visit: Payer: Managed Care, Other (non HMO) | Admitting: Physician Assistant

## 2017-10-01 ENCOUNTER — Encounter: Payer: Self-pay | Admitting: Physician Assistant

## 2017-10-01 VITALS — BP 185/97 | HR 55 | Temp 98.6°F | Resp 18 | Ht 68.11 in | Wt 223.6 lb

## 2017-10-01 NOTE — Progress Notes (Signed)
Patient left without being seen by me due to wait time.  I was out of my last room at 10:44 and her appointment time was 10:20.  Philis Fendt, MS, PA-C 10:45 AM, 10/01/2017

## 2017-10-01 NOTE — Patient Instructions (Signed)
     IF you received an x-ray today, you will receive an invoice from [facility]. Please contact [facility] at [phone] with questions or concerns regarding your invoice.   IF you received labwork today, you will receive an invoice from [facility]. Please contact [facility] at [phone] with questions or concerns regarding your invoice.   Our billing staff will not be able to assist you with questions regarding bills from these companies.  You will be contacted with the lab results as soon as they are available. The fastest way to get your results is to activate your My Chart account. Instructions are located on the last page of this paperwork. If you have not heard from us regarding the results in 2 weeks, please contact this office.     

## 2017-10-07 NOTE — Progress Notes (Signed)
  Subjective:  Patient ID: Veronica Pollard, female    DOB: 1971-03-10,  MRN: [id number]  Chief Complaint  Patient presents with  . Routine Post Op    dos 04.24.2019 Metatarsal Head Res. 5th Lt   "Doing just fine"  . Suture / Staple Removal    Removed sutures today    DOS: 07/11/17 Procedure: L 5th metataral head resection.  47 y.o. female returns for post-op check. Denies N/V/F/Ch. Here for suture removal.  Objective:   General AA&O x3. Normal mood and affect.  Vascular Foot warm and well perfused.  Neurologic Gross sensation intact.  Dermatologic Skin healing well without signs of infection. Skin edges well coapted without signs of infection.  Orthopedic: Tenderness to palpation noted about the surgical site.    Assessment & Plan:  Patient was evaluated and treated and all questions answered.  S/p L 5th metatarsal head resection -Progressing as expected post-operatively. -Sutures: removed. -Medications refilled: none -Foot redressed.  Return in about 2 weeks (around 08/10/2017) for Post-op.

## 2017-11-15 ENCOUNTER — Telehealth: Payer: Self-pay | Admitting: Family Medicine

## 2017-11-15 NOTE — Telephone Encounter (Signed)
Copied from [place] [id number]. Topic: General - Other >> Nov 15, 2017  3:38 PM Mcneil, Ja-Kwan wrote: Reason for CRM: Pt states she is scheduled for jury duty but she is having lots of problems with her hip and she can not sit or stand for long periods of time. Pt is requesting a doctor note explaining her condition. Cb# [id number]

## 2017-11-16 NOTE — Telephone Encounter (Signed)
Also states it needs to say Attention Cleopatra Cedar request.

## 2017-11-16 NOTE — Telephone Encounter (Signed)
Pt states that the courthouse told her if the note states she has an appointment on 11/22/17 for her neck and hip pain she will be excused from jury duty. Patients need the note before Thursday, 11/22/17.

## 2017-11-16 NOTE — Telephone Encounter (Signed)
Patient states it can be faxed to her [place]. She would also still like to get a copy of the note. Please contact when it is available.   Fax# [place] CB# [phone]

## 2017-11-20 NOTE — Telephone Encounter (Signed)
Received call from pt re f/u on request for letter.  States she needs letter to excuse her from jury duty due to arthritis and IBS.  Needs letter before her existing 09/05 OV.  Reviewed chart and pt's request.  Advised unable to send letter without re-evaluating pt.  Pt cites mult concerns incl need for re-evaluation, being scheduled in April 2019 with PA instead of MD, wants refund of bill if the provider sees her and not agreeable to provide excuse from jury duty.  Rescheduled for 9/4 so that letter could be given in time, if provider agrees to letter. Concerns passed along to leadership as requested by pt.  Clerical called to confirm and states pt no longer wants 9/4 appt but will keep 9/5 appt.

## 2017-11-21 ENCOUNTER — Ambulatory Visit: Payer: Managed Care, Other (non HMO) | Admitting: Emergency Medicine

## 2017-11-22 ENCOUNTER — Ambulatory Visit (INDEPENDENT_AMBULATORY_CARE_PROVIDER_SITE_OTHER): Payer: Managed Care, Other (non HMO)

## 2017-11-22 ENCOUNTER — Ambulatory Visit: Payer: Managed Care, Other (non HMO) | Admitting: Emergency Medicine

## 2017-11-22 ENCOUNTER — Other Ambulatory Visit: Payer: Self-pay

## 2017-11-22 ENCOUNTER — Ambulatory Visit: Payer: Managed Care, Other (non HMO) | Admitting: Physician Assistant

## 2017-11-22 ENCOUNTER — Ambulatory Visit (HOSPITAL_COMMUNITY)
Admission: EM | Admit: 2017-11-22 | Discharge: 2017-11-22 | Disposition: A | Discharge status: Home / Self Care | Payer: Managed Care, Other (non HMO) | Attending: Family Medicine | Admitting: Family Medicine

## 2017-11-22 ENCOUNTER — Encounter (HOSPITAL_COMMUNITY): Payer: Self-pay | Admitting: *Deleted

## 2017-11-22 DIAGNOSIS — G8929 Other chronic pain: Secondary | ICD-10-CM | POA: Diagnosis not present

## 2017-11-22 DIAGNOSIS — M79645 Pain in left finger(s): Secondary | ICD-10-CM | POA: Diagnosis not present

## 2017-11-22 DIAGNOSIS — W19XXXA Unspecified fall, initial encounter: Secondary | ICD-10-CM

## 2017-11-22 DIAGNOSIS — M1990 Unspecified osteoarthritis, unspecified site: Secondary | ICD-10-CM

## 2017-11-22 DIAGNOSIS — M25551 Pain in right hip: Secondary | ICD-10-CM | POA: Diagnosis not present

## 2017-11-22 MED ORDER — KETOROLAC TROMETHAMINE 60 MG/2ML IM SOLN
INTRAMUSCULAR | Status: AC
  Filled 2017-11-22: qty 2

## 2017-11-22 MED ORDER — KETOROLAC TROMETHAMINE 60 MG/2ML IM SOLN
60.0000 mg | Freq: Once | INTRAMUSCULAR | Status: AC
  Administered 2017-11-22: 60 mg via INTRAMUSCULAR

## 2017-11-22 MED ORDER — MELOXICAM 7.5 MG PO TABS: 7.5000 mg | ORAL_TABLET | Freq: Every day | ORAL | 0 refills | Status: DC

## 2017-11-22 NOTE — Discharge Instructions (Signed)
Xrays without sign of any broken bones here today.  Strain is likely after your fall, as well as arthritis to your right hip.  Will restart a short course of daily meloxicam. Start tomorrow, take with food.  Please take your blood pressure medication today.  Please follow up with your PCP for bp recheck.  Please follow up with orthopedics for further evaluation and treatment of your hip pain.

## 2017-11-22 NOTE — ED Provider Notes (Signed)
[facility]    CSN: [id number] Arrival date & time: 11/22/17  7253     History   Chief Complaint Chief Complaint  Patient presents with  . Fall    HPI Veronica Pollard is a 47 y.o. female.   Camy presents with complaints of right hip pain which radiates to buttocks and groin, as well as left thumb pain, after a fall on her stairs this morning. States she has had intermittent hip pain for some time now and hip will intermittently "go out." this morning that is what occurred, causing her to twist and fall forward. She went forward and her right leg twisted behind her. She caught herself with left outstretched hand. Pain with any movement to right hip. No numbness or tingling to foot or to thumb. Took two tylenol this morning which did not help with pain. Pain 10/10. States has chronic back and neck pain and arthritis. Has taken meloxicam in the past which was beneficial but was taken off due to her BP. She did not take her BP medication this morning. She has followed with ortho in the past related to left foot, states her orthopedist no longer practices however. No previous hip or thumb injury.     ROS per HPI.      Past Medical History:  Diagnosis Date  . Hypertension   . Renal disorder    early stage kidney disease    Patient Active Problem List   Diagnosis Date Noted  . HYPERCHOLESTEROLEMIA 05/25/2010  . FIBROIDS, UTERUS 05/12/2010  . EXTERNAL HEMORRHOIDS 05/12/2010  . IRRITABLE BOWEL SYNDROME 05/12/2010  . CALLUSES, LEFT FOOT 05/12/2010  . 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 CARPAL TUNNEL SYNDROME, BILATERAL, HX OF 05/12/2010  . OBESITY, NOS 05/17/2006  . ANEMIA, IRON DEFICIENCY, UNSPEC. 05/17/2006  . HYPERTENSION, BENIGN SYSTEMIC 05/17/2006    Past Surgical History:  Procedure Laterality Date  . ABDOMINAL HYSTERECTOMY    . BREAST BIOPSY Right 2014   benign  . CARPAL TUNNEL RELEASE    . CHOLECYSTECTOMY    . HAMMER TOE SURGERY        OB History   None      Home Medications    Prior to Admission medications   Medication Sig Start Date End Date Taking? Authorizing Provider  meloxicam (MOBIC) 7.5 MG tablet Take 1 tablet (7.5 mg total) by mouth daily. 11/22/17   Zigmund Gottron, NP  metoprolol tartrate (LOPRESSOR) 50 MG tablet Take 1 tablet (50 mg total) by mouth daily. 06/29/17   Tereasa Coop, PA-C  oxyCODONE-acetaminophen (PERCOCET) 10-325 MG tablet Take 1 tablet by mouth every 4 (four) hours as needed for pain. Patient not taking: Reported on 10/01/2017 07/11/17   Evelina Bucy, DPM  pravastatin (PRAVACHOL) 20 MG tablet Take 1 tablet (20 mg total) by mouth daily. 06/29/17   Tereasa Coop, PA-C  promethazine (PHENERGAN) 25 MG tablet Take 1 tablet (25 mg total) by mouth every 8 (eight) hours as needed for nausea or vomiting. Patient not taking: Reported on 10/01/2017 07/11/17   Evelina Bucy, DPM    Family History History reviewed. No pertinent family history.  Social History Social History   Tobacco Use  . Smoking status: Light Tobacco Smoker    Last attempt to quit: 05/07/2012    Years since quitting: 5.5  . Smokeless tobacco: Never Used  Substance Use Topics  . Alcohol use: Yes    Comment: socially  . Drug use: No  Allergies   Penicillins and Penicillins   Review of Systems Review of Systems   Physical Exam Triage Vital Signs ED Triage Vitals  Enc Vitals Group     BP 11/22/17 0813 (!) 209/121     Pulse Rate 11/22/17 0813 89     Resp 11/22/17 0813 20     Temp 11/22/17 0813 98.1 F (36.7 C)     Temp Source 11/22/17 0813 Oral     SpO2 11/22/17 0813 100 %     Weight --      Height --      Head Circumference --      Peak Flow --      Pain Score 11/22/17 0816 10     Pain Loc --      Pain Edu? --      Excl. in [place]? --    No data found.  Updated Vital Signs BP (!) 196/124 (BP Location: Right Arm)   Pulse 62   Temp 98.1 F (36.7 C) (Oral)   Resp 18   SpO2 98%     Physical Exam  Constitutional: She is oriented to person, place, and time. She appears well-developed and well-nourished. No distress.  Cardiovascular: Normal rate, regular rhythm and normal pulses.  No CP, shortness of breath   Pulmonary/Chest: Effort normal and breath sounds normal.  Musculoskeletal:       Left wrist: Normal.       Right hip: She exhibits decreased range of motion, decreased strength, tenderness and bony tenderness. She exhibits no swelling, no crepitus, no deformity and no laceration.       Left hand: She exhibits decreased range of motion, tenderness and bony tenderness. She exhibits normal two-point discrimination, normal capillary refill, no deformity, no laceration and no swelling. Normal sensation noted. Decreased strength noted. She exhibits thumb/finger opposition.  Right hip pain, noticeable discomfort to radiate to groin, with all ROM; pain with femur log roll; tenderness to greater trochanter on palpation; pain with hip flexion and extension as well as external rotation; pain to left thumb MCP joint on palpation; no snuff box tenderness; good lateral/medial strength; cap refill < 2 seconds; pain with opposition; no redness or swelling   Neurological: She is alert and oriented to person, place, and time.  Skin: Skin is warm and dry.     UC Treatments / Results  Labs (all labs ordered are listed, but only abnormal results are displayed) Labs Reviewed - No data to display  EKG None  Radiology Dg Hand Complete Left  Result Date: 11/22/2017 CLINICAL DATA:  [place] down 3 steps this morning, felt like hip went out, caught fall with LEFT hand, injury LEFT hand especially pain at LEFT thumb EXAM: LEFT HAND - COMPLETE 3+ VIEW COMPARISON:  None FINDINGS: Osseous mineralization normal. Joint spaces preserved. No acute fracture, dislocation, or bone destruction. IMPRESSION: No acute osseous abnormalities. Electronically Signed   By: Lavonia Dana M.D.   On: 11/22/2017  09:27   Dg Hip Unilat W Or Wo Pelvis 2-3 Views Right  Result Date: 11/22/2017 CLINICAL DATA:  [place] down 3 steps this morning, felt like hip went out, caught fall with LEFT hand, LEFT hip pain, LEFT thumb pain, initial encounter EXAM: DG HIP (WITH OR WITHOUT PELVIS) 2-3V RIGHT COMPARISON:  None FINDINGS: Osseous mineralization normal. Joint space narrowing and minimal spur formation. RIGHT SI joint appears intact. No acute fracture, dislocation or bone destruction. IMPRESSION: Degenerative changes RIGHT hip. No acute osseous abnormalities. Electronically Signed  By: Lavonia Dana M.D.   On: 11/22/2017 09:29    Procedures Procedures (including critical care time)  Medications Ordered in UC Medications  ketorolac (TORADOL) injection 60 mg (60 mg Intramuscular Given 11/22/17 0842)    Initial Impression / Assessment and Plan / UC Course  I have reviewed the triage vital signs and the nursing notes.  Pertinent labs & imaging results that were available during my care of the patient were reviewed by me and considered in my medical decision making (see chart for details).     Xrays reassuring today. toradol provided, patient with significant improvement, pain 1/10 at time of discharge. Ambulatory without difficulty. Encouraged to take BP meds once home, asymptomatic at this time. 15 days of meloxicam. Follow with PCP and/or ortho for long term management of arthritis. Patient verbalized understanding and agreeable to plan.   Final Clinical Impressions(s) / UC Diagnoses   Final diagnoses:  Arthritis  Pain of right hip joint  Thumb pain, left  Fall, initial encounter     Discharge Instructions     Xrays without sign of any broken bones here today.  Strain is likely after your fall, as well as arthritis to your right hip.  Will restart a short course of daily meloxicam. Start tomorrow, take with food.  Please take your blood pressure medication today.  Please follow up with your PCP for bp  recheck.  Please follow up with orthopedics for further evaluation and treatment of your hip pain.     ED Prescriptions    Medication Sig Dispense Auth. Provider   meloxicam (MOBIC) 7.5 MG tablet Take 1 tablet (7.5 mg total) by mouth daily. 15 tablet Zigmund Gottron, NP     Controlled Substance Prescriptions [place] Controlled Substance Registry consulted? Not Applicable   Zigmund Gottron, NP 11/22/17 [phone]

## 2017-11-22 NOTE — Telephone Encounter (Signed)
Pt cancelled 11/21/17 visit and no showed 11/22/17 visit today. Dgaddy, CMA

## 2017-11-22 NOTE — ED Triage Notes (Signed)
States she fell down 3 steps c/o pain in right hip and left thumb pain

## 2017-11-29 ENCOUNTER — Other Ambulatory Visit: Payer: Self-pay | Admitting: Cardiology

## 2017-11-29 DIAGNOSIS — Z1231 Encounter for screening mammogram for malignant neoplasm of breast: Secondary | ICD-10-CM

## 2017-12-04 ENCOUNTER — Ambulatory Visit (INDEPENDENT_AMBULATORY_CARE_PROVIDER_SITE_OTHER): Payer: Managed Care, Other (non HMO) | Admitting: Orthopaedic Surgery

## 2017-12-04 ENCOUNTER — Encounter (INDEPENDENT_AMBULATORY_CARE_PROVIDER_SITE_OTHER): Payer: Self-pay | Admitting: Orthopaedic Surgery

## 2017-12-04 DIAGNOSIS — M1611 Unilateral primary osteoarthritis, right hip: Secondary | ICD-10-CM | POA: Diagnosis not present

## 2017-12-04 MED ORDER — ETODOLAC 400 MG PO TABS: 400.0000 mg | ORAL_TABLET | Freq: Two times a day (BID) | ORAL | 3 refills | Status: DC | PRN

## 2017-12-04 NOTE — Patient Instructions (Signed)
    Vitamin D3:  Take 5000 IU daily  Glucosamine Sulfate:  1000 mg twice daily  Turmeric:  500 mg twice daily
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
## 2017-12-04 NOTE — Progress Notes (Signed)
Subjective: I was asked to inject her right hip under ultrasound guidance today for osteoarthritis.  Patient states that she would really like to avoid injection if possible, she is very concerned that she might gain weight due to the steroid.  This is happened her in the past when she had her thumb injected.  We discussed the possibility of using Toradol instead, but she would prefer to try other methods of pain relief.  I recommended glucosamine, turmeric, avoidance of sugar, and she will try Lodine as needed.  She will also take vitamin D3.  If symptoms worsen we will try a Toradol injection intra-articular.  If that does not help, then steroid injection.

## 2017-12-25 ENCOUNTER — Ambulatory Visit
Admission: RE | Admit: 2017-12-25 | Discharge: 2017-12-25 | Disposition: A | Discharge status: Home / Self Care | Payer: Managed Care, Other (non HMO) | Source: Ambulatory Visit | Attending: Cardiology | Admitting: Cardiology

## 2017-12-25 DIAGNOSIS — Z1231 Encounter for screening mammogram for malignant neoplasm of breast: Secondary | ICD-10-CM

## 2018-05-03 ENCOUNTER — Ambulatory Visit: Payer: Managed Care, Other (non HMO)

## 2018-05-03 ENCOUNTER — Encounter: Payer: Managed Care, Other (non HMO) | Admitting: Podiatry

## 2018-05-03 DIAGNOSIS — M779 Enthesopathy, unspecified: Principal | ICD-10-CM

## 2018-05-08 ENCOUNTER — Encounter (HOSPITAL_COMMUNITY): Payer: Self-pay | Admitting: Emergency Medicine

## 2018-05-08 ENCOUNTER — Ambulatory Visit (HOSPITAL_COMMUNITY)
Admission: EM | Admit: 2018-05-08 | Discharge: 2018-05-08 | Disposition: A | Discharge status: Home / Self Care | Payer: Managed Care, Other (non HMO) | Attending: Family Medicine | Admitting: Family Medicine

## 2018-05-08 DIAGNOSIS — B070 Plantar wart: Secondary | ICD-10-CM | POA: Diagnosis not present

## 2018-05-08 DIAGNOSIS — T304 Corrosion of unspecified body region, unspecified degree: Secondary | ICD-10-CM

## 2018-05-08 MED ORDER — HYDROCODONE-ACETAMINOPHEN 5-325 MG PO TABS: 1.0000 | ORAL_TABLET | Freq: Four times a day (QID) | ORAL | 0 refills | Status: DC | PRN

## 2018-05-08 NOTE — ED Triage Notes (Signed)
Pt presents to [facility] for assessment of left foot pain with a hx of repetitive warts to this area, with foot surgery in the past.  Patient states she placed Compoudn W on foot and covered 1 week ago.  Area now sloughing, white, painful, and she can't remove the areas she wanted to.

## 2018-05-08 NOTE — Discharge Instructions (Signed)
Discontinue the Compound W.  The pain should gradually subside over the next few days.  You need to follow-up with a dermatologist.
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
## 2018-05-15 IMAGING — MG 2D DIGITAL SCREENING BILATERAL MAMMOGRAM WITH CAD AND ADJUNCT TO
8 of 12 series · 8 of 28 positions shown · non-contrast
Comparison: Previous exam(s).

CLINICAL DATA: Screening.

EXAM:
2D DIGITAL SCREENING BILATERAL MAMMOGRAM WITH CAD AND ADJUNCT TOMO

[R MLO synth-2D]
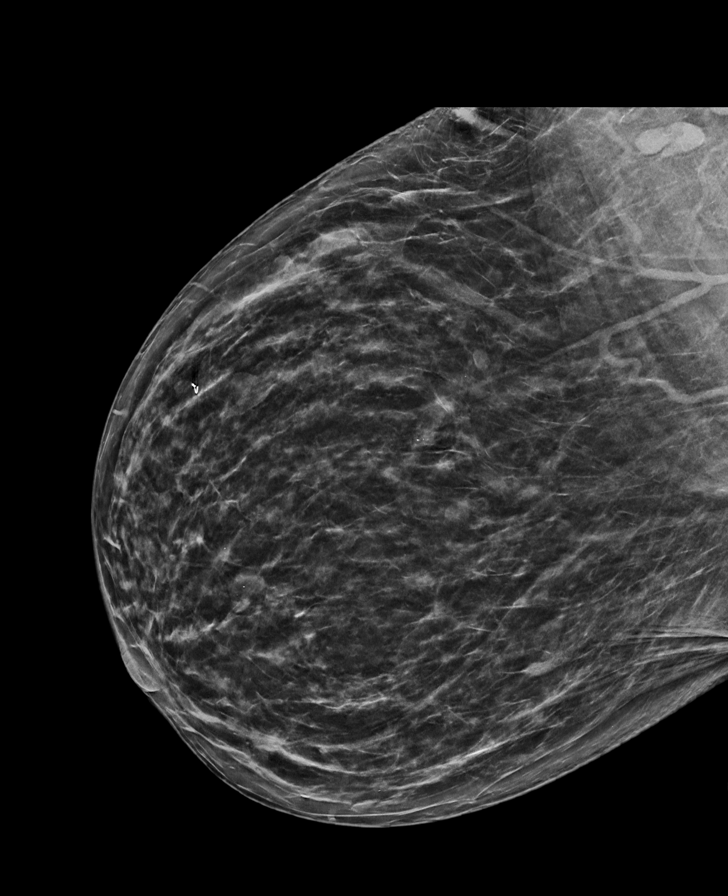

[L CC]
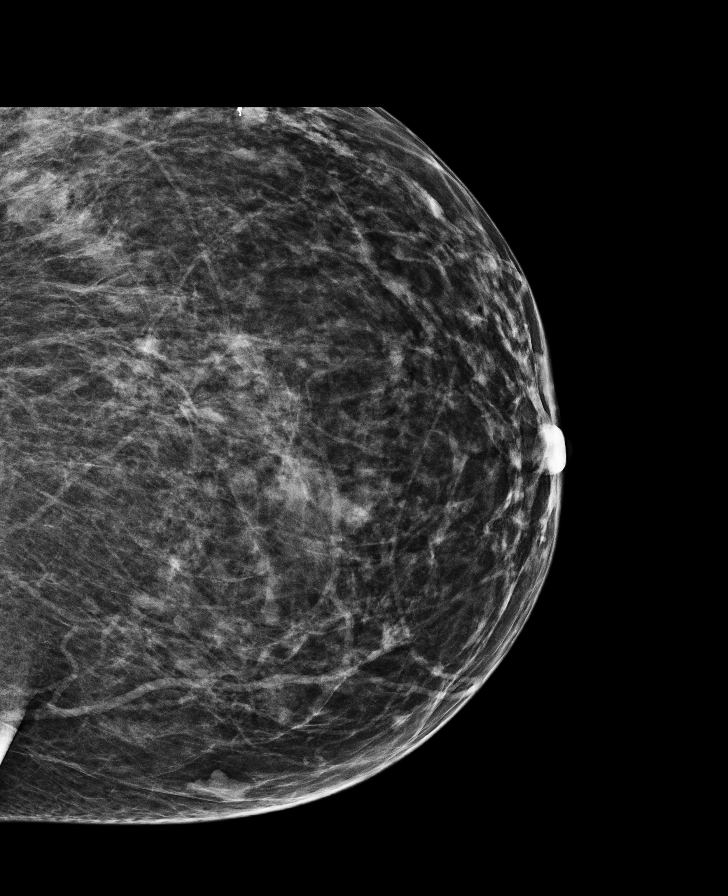

[L MLO synth-2D]
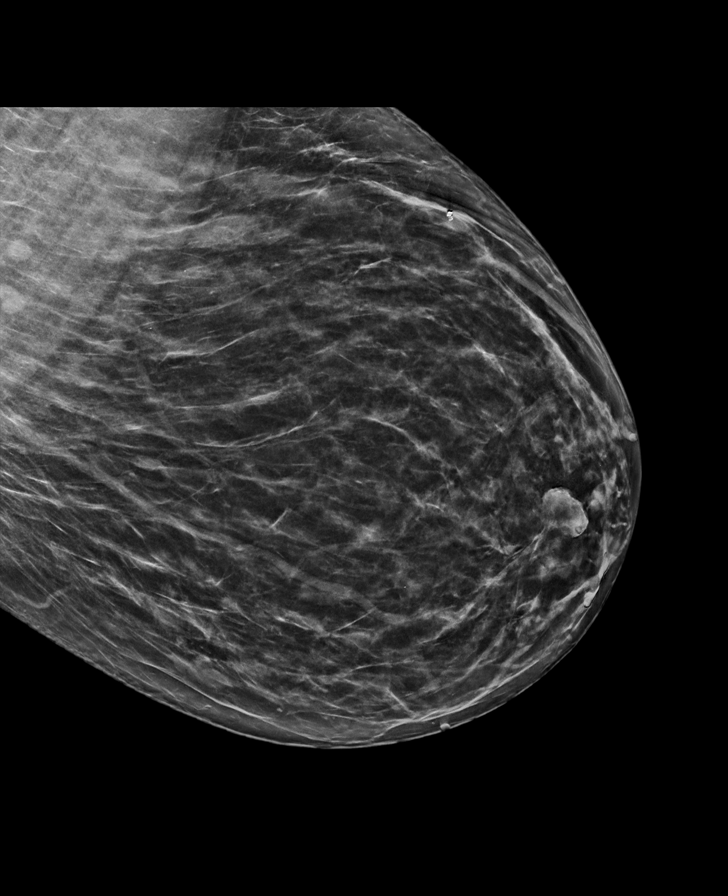

[R CC]
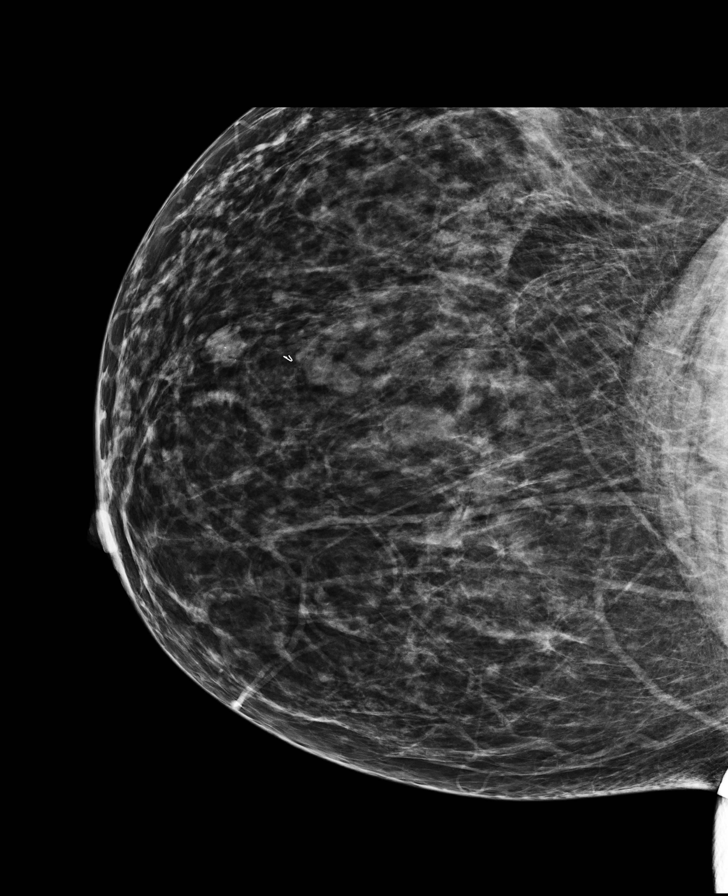

[L MLO]
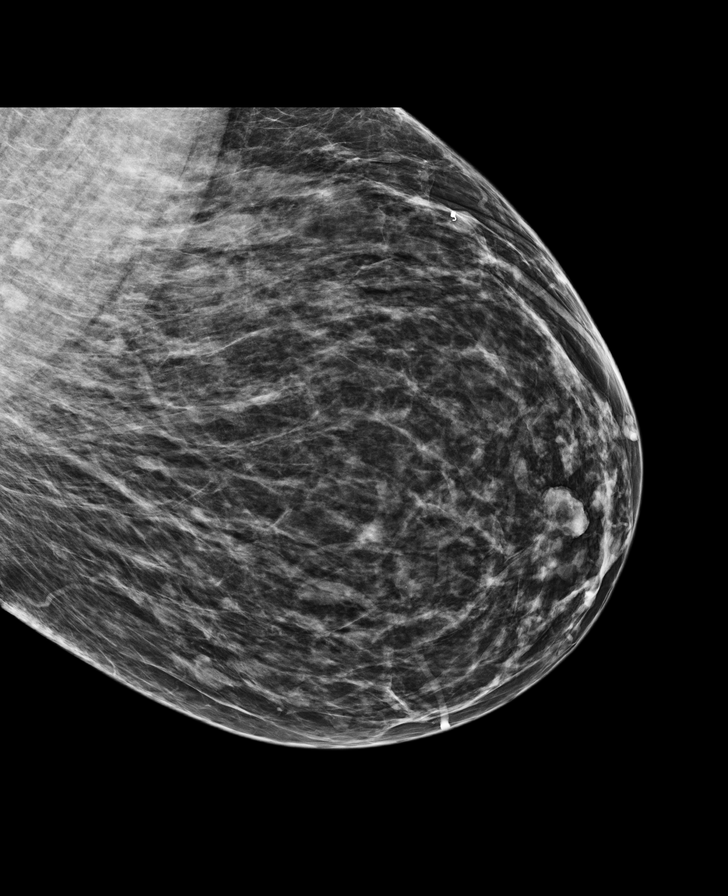

[R CC synth-2D]
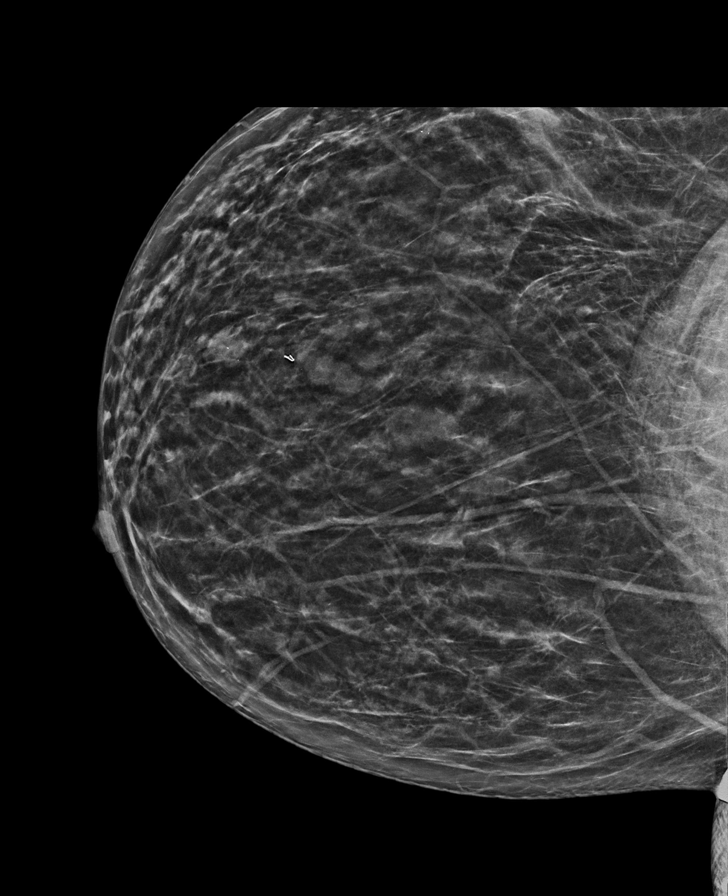

[L CC synth-2D]
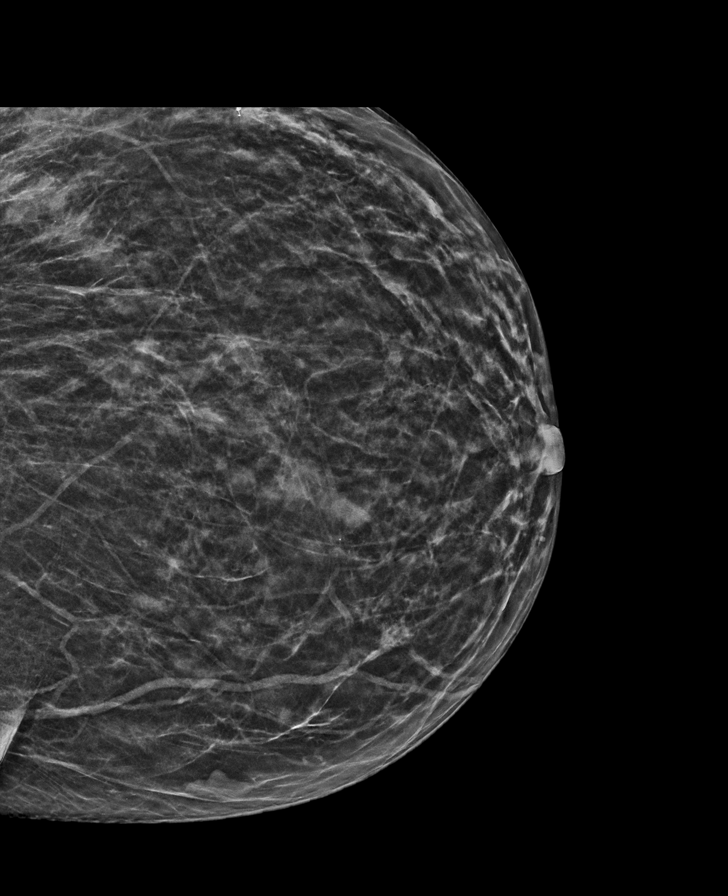

[R MLO]
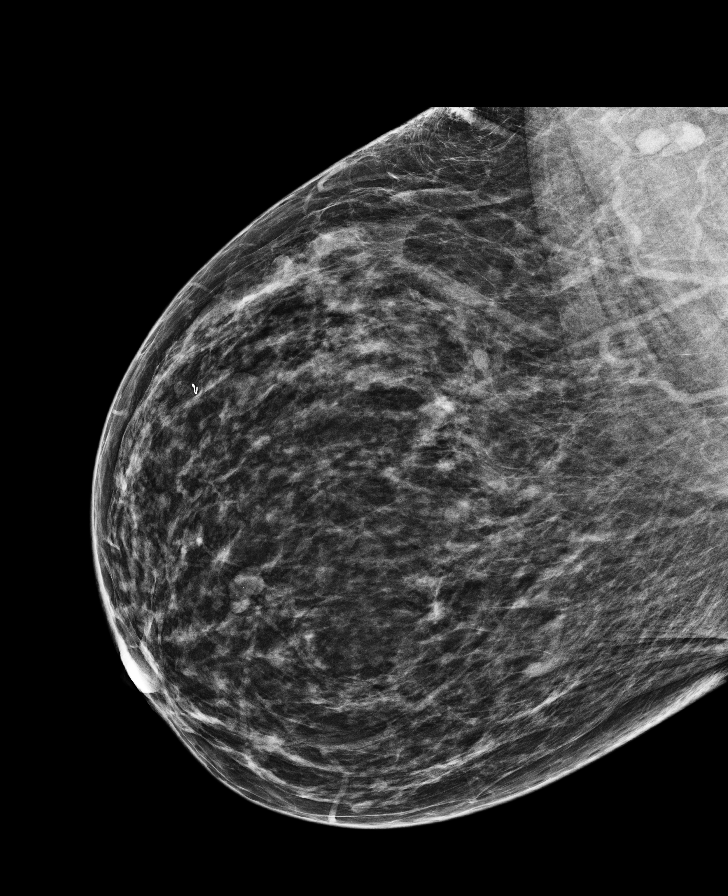

[8 of 28 positions shown; findings below may reference images not displayed]

ACR Breast Density Category c: The breast tissue is heterogeneously
dense, which may obscure small masses.
FINDINGS: There are no findings suspicious for malignancy. Images were
processed with CAD.
IMPRESSION: No mammographic evidence of malignancy. A result letter of this
screening mammogram will be mailed directly to the patient.

RECOMMENDATION:
Screening mammogram in one year. (Code:[id number]-[id number])

BI-RADS CATEGORY  1: Negative.

## 2018-05-18 NOTE — Progress Notes (Signed)
Error

## 2018-07-01 ENCOUNTER — Encounter: Payer: Self-pay | Admitting: Emergency Medicine

## 2018-07-01 ENCOUNTER — Other Ambulatory Visit: Payer: Self-pay

## 2018-07-01 ENCOUNTER — Telehealth (INDEPENDENT_AMBULATORY_CARE_PROVIDER_SITE_OTHER): Payer: Managed Care, Other (non HMO) | Admitting: Emergency Medicine

## 2018-07-01 DIAGNOSIS — I10 Essential (primary) hypertension: Secondary | ICD-10-CM

## 2018-07-01 DIAGNOSIS — E785 Hyperlipidemia, unspecified: Secondary | ICD-10-CM

## 2018-07-01 MED ORDER — PRAVASTATIN SODIUM 20 MG PO TABS: 20.0000 mg | ORAL_TABLET | Freq: Every day | ORAL | 3 refills | Status: DC

## 2018-07-01 MED ORDER — METOPROLOL TARTRATE 50 MG PO TABS: 50.0000 mg | ORAL_TABLET | Freq: Every day | ORAL | 3 refills | Status: DC

## 2018-07-01 NOTE — Addendum Note (Signed)
Addended by: Davina Poke on: 07/01/2018 11:41 AM   Modules accepted: Orders

## 2018-07-01 NOTE — Progress Notes (Signed)
Telemedicine Encounter- SOAP NOTE Established Patient  This telephone encounter was conducted with the patient's (or proxy's) verbal consent via audio telecommunications: yes/no: Yes Patient was instructed to have this encounter in a suitably private space; and to only have persons present to whom they give permission to participate. In addition, patient identity was confirmed by use of name plus two identifiers (DOB and address).  I discussed the limitations, risks, security and privacy concerns of performing an evaluation and management service by telephone and the availability of in person appointments. I also discussed with the patient that there may be a patient responsible charge related to this service. The patient expressed understanding and agreed to proceed.  I spent a total of TIME; 0 MIN TO 60 MIN: 15 minutes talking with the patient or their proxy.  No chief complaint on file. Follow-up of hypertension and high cholesterol  Subjective   Veronica Pollard is a 48 y.o. female established patient.  Used to see PA Carlis Abbott.  Telephone visit today for follow-up on high blood pressure and high cholesterol.  Needs medication refills.  Has no complaints or medical concerns today.  HPI   Patient Active Problem List   Diagnosis Date Noted  . Unilateral primary osteoarthritis, right hip 12/04/2017  . HYPERCHOLESTEROLEMIA 05/25/2010  . FIBROIDS, UTERUS 05/12/2010  . EXTERNAL HEMORRHOIDS 05/12/2010  . IRRITABLE BOWEL SYNDROME 05/12/2010  . CALLUSES, LEFT FOOT 05/12/2010  . 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 CARPAL TUNNEL SYNDROME, BILATERAL, HX OF 05/12/2010  . OBESITY, NOS 05/17/2006  . ANEMIA, IRON DEFICIENCY, UNSPEC. 05/17/2006  . HYPERTENSION, BENIGN SYSTEMIC 05/17/2006    Past Medical History:  Diagnosis Date  . Hypertension   . Renal disorder    early stage kidney disease    Current Outpatient Medications  Medication Sig Dispense Refill  .  HYDROcodone-acetaminophen (NORCO) 5-325 MG tablet Take 1 tablet by mouth every 6 (six) hours as needed for moderate pain. 12 tablet 0  . metoprolol tartrate (LOPRESSOR) 50 MG tablet Take 1 tablet (50 mg total) by mouth daily. 180 tablet 3  . pravastatin (PRAVACHOL) 20 MG tablet Take 1 tablet (20 mg total) by mouth daily. 90 tablet 3   No current facility-administered medications for this visit.     Allergies  Allergen Reactions  . Penicillins     REACTION: Swollen throat, difficult breathing  . Penicillins     Social History   Socioeconomic History  . Marital status: Married    Spouse name: Not on file  . Number of children: Not on file  . Years of education: Not on file  . Highest education level: Not on file  Occupational History  . Not on file  Social Needs  . Financial resource strain: Not on file  . Food insecurity:    Worry: Not on file    Inability: Not on file  . Transportation needs:    Medical: Not on file    Non-medical: Not on file  Tobacco Use  . Smoking status: Heavy Tobacco Smoker    Packs/day: 0.50    Last attempt to quit: 05/07/2012    Years since quitting: 6.1  . Smokeless tobacco: Never Used  Substance and Sexual Activity  . Alcohol use: Yes    Comment: socially  . Drug use: No  . Sexual activity: Not on file  Lifestyle  . Physical activity:    Days per week: Not on file    Minutes per session: Not on file  .  Stress: Not on file  Relationships  . Social connections:    Talks on phone: Not on file    Gets together: Not on file    Attends religious service: Not on file    Active member of club or organization: Not on file    Attends meetings of clubs or organizations: Not on file    Relationship status: Not on file  . Intimate partner violence:    Fear of current or ex partner: Not on file    Emotionally abused: Not on file    Physically abused: Not on file    Forced sexual activity: Not on file  Other Topics Concern  . Not on file   Social History Narrative  . Not on file    Review of Systems  Constitutional: Negative.  Negative for chills and fever.  HENT: Negative.   Eyes: Negative.  Negative for blurred vision and double vision.  Respiratory: Negative.  Negative for cough and shortness of breath.   Cardiovascular: Negative.  Negative for chest pain and palpitations.  Gastrointestinal: Negative.  Negative for abdominal pain, diarrhea, nausea and vomiting.  Musculoskeletal: Negative.  Negative for myalgias.  Skin: Negative.  Negative for rash.  Neurological: Negative for dizziness and headaches.  Endo/Heme/Allergies: Negative.   All other systems reviewed and are negative.   Objective   Vitals as reported by the patient: Blood pressure: 110/80, self-reported. Awake and oriented x3 in no apparent respiratory distress while talking to me on the phone. There were no vitals filed for this visit.  There are no diagnoses linked to this encounter. Clinically stable.  No medical concerns identified during this visit.  Continue present medications. Keep scheduled appointment for May 14. Diagnoses and all orders for this visit:  Essential hypertension  Dyslipidemia     I discussed the assessment and treatment plan with the patient. The patient was provided an opportunity to ask questions and all were answered. The patient agreed with the plan and demonstrated an understanding of the instructions.   The patient was advised to call back or seek an in-person evaluation if the symptoms worsen or if the condition fails to improve as anticipated.  I provided 15 minutes of non-face-to-face time during this encounter.  Horald Pollen, MD  Primary Care at [facility]

## 2018-07-01 NOTE — Progress Notes (Signed)
Called patient for Virtual appointment to triage. Patient has an appointment 08/01/2018 to establish care with Dr Mitchel Honour. Patient states she has high blood pressure and takes metoprolol, pravastatin and Hydrocodone-acetaminophen as needed for hip pain.

## 2018-07-02 ENCOUNTER — Other Ambulatory Visit: Payer: Self-pay | Admitting: Physician Assistant

## 2018-08-01 ENCOUNTER — Other Ambulatory Visit: Payer: Self-pay

## 2018-08-01 ENCOUNTER — Telehealth (INDEPENDENT_AMBULATORY_CARE_PROVIDER_SITE_OTHER): Payer: Managed Care, Other (non HMO) | Admitting: Family Medicine

## 2018-08-01 ENCOUNTER — Telehealth: Payer: Self-pay | Admitting: Family Medicine

## 2018-08-01 DIAGNOSIS — Z1231 Encounter for screening mammogram for malignant neoplasm of breast: Secondary | ICD-10-CM

## 2018-08-01 DIAGNOSIS — E785 Hyperlipidemia, unspecified: Secondary | ICD-10-CM

## 2018-08-01 DIAGNOSIS — R0683 Snoring: Secondary | ICD-10-CM

## 2018-08-01 DIAGNOSIS — R351 Nocturia: Secondary | ICD-10-CM

## 2018-08-01 DIAGNOSIS — I10 Essential (primary) hypertension: Secondary | ICD-10-CM

## 2018-08-01 DIAGNOSIS — Z9189 Other specified personal risk factors, not elsewhere classified: Secondary | ICD-10-CM

## 2018-08-01 DIAGNOSIS — M160 Bilateral primary osteoarthritis of hip: Secondary | ICD-10-CM
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
## 2018-08-01 NOTE — Telephone Encounter (Signed)
08/01/2018 - PATIENT HAD A TELEMED OFFICE VISIT WITH DR. Benay Spice ON THURS. (08/01/2018). DR. Benay Spice HAS REQUESTED SHE HAVE FASTING LABS DONE IN 1 WEEK (ORDERS ARE IN) AND A FOLLOW-UP APPOINTMENT  IN 1 MONTH. I TRIED TO CALL AND SCHEDULE BUT HAD TO LEAVE HER A VOICE MAIL TO RETURN OUR CALL. [place]
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
## 2018-08-06 ENCOUNTER — Ambulatory Visit (INDEPENDENT_AMBULATORY_CARE_PROVIDER_SITE_OTHER): Payer: Managed Care, Other (non HMO) | Admitting: Family Medicine

## 2018-08-06 ENCOUNTER — Other Ambulatory Visit: Payer: Self-pay

## 2018-08-06 DIAGNOSIS — E785 Hyperlipidemia, unspecified: Secondary | ICD-10-CM

## 2018-08-06 DIAGNOSIS — R351 Nocturia: Secondary | ICD-10-CM

## 2018-08-06 DIAGNOSIS — M160 Bilateral primary osteoarthritis of hip: Secondary | ICD-10-CM

## 2018-08-06 DIAGNOSIS — I10 Essential (primary) hypertension: Secondary | ICD-10-CM

## 2018-08-07 LAB — COMPREHENSIVE METABOLIC PANEL
ALT: 9 IU/L (ref 0–32)
AST: 12 IU/L (ref 0–40)
Albumin/Globulin Ratio: 1.4 (ref 1.2–2.2)
Albumin: 4.2 g/dL (ref 3.8–4.8)
Alkaline Phosphatase: 73 IU/L (ref 39–117)
BUN/Creatinine Ratio: 9 (ref 9–23)
BUN: 8 mg/dL (ref 6–24)
Bilirubin Total: 0.3 mg/dL (ref 0.0–1.2)
CO2: 24 mmol/L (ref 20–29)
Calcium: 9.3 mg/dL (ref 8.7–10.2)
Chloride: 102 mmol/L (ref 96–106)
Creatinine, Ser: 0.91 mg/dL (ref 0.57–1.00)
GFR calc Af Amer: 86 mL/min/{1.73_m2} (ref >59)
GFR calc non Af Amer: 75 mL/min/{1.73_m2} (ref >59)
Globulin, Total: 3 g/dL (ref 1.5–4.5)
Glucose: 102 mg/dL — ABNORMAL HIGH (ref 65–99)
Potassium: 3.5 mmol/L (ref 3.5–5.2)
Sodium: 140 mmol/L (ref 134–144)
Total Protein: 7.2 g/dL (ref 6.0–8.5)

## 2018-08-07 LAB — HEMOGLOBIN A1C
Est. average glucose Bld gHb Est-mCnc: 120 mg/dL
Hgb A1c MFr Bld: 5.8 % — ABNORMAL HIGH (ref 4.8–5.6)

## 2018-08-07 LAB — VITAMIN D 25 HYDROXY (VIT D DEFICIENCY, FRACTURES): Vit D, 25-Hydroxy: 9.6 ng/mL — ABNORMAL LOW (ref 30.0–100.0)

## 2018-08-07 LAB — CBC
Hematocrit: 36.2 % (ref 34.0–46.6)
Hemoglobin: 11.8 g/dL (ref 11.1–15.9)
MCH: 27.5 pg (ref 26.6–33.0)
MCHC: 32.6 g/dL (ref 31.5–35.7)
MCV: 84 fL (ref 79–97)
Platelets: 249 10*3/uL (ref 150–450)
RBC: 4.29 x10E6/uL (ref 3.77–5.28)
RDW: 14.5 % (ref 11.7–15.4)
WBC: 7.3 10*3/uL (ref 3.4–10.8)

## 2018-08-07 LAB — URINALYSIS, ROUTINE W REFLEX MICROSCOPIC
Bilirubin, UA: NEGATIVE
Glucose, UA: NEGATIVE
Ketones, UA: NEGATIVE
Leukocytes,UA: NEGATIVE
Nitrite, UA: NEGATIVE
RBC, UA: NEGATIVE
Specific Gravity, UA: 1.026 (ref 1.005–1.030)
Urobilinogen, Ur: 0.2 mg/dL (ref 0.2–1.0)
pH, UA: 6.5 (ref 5.0–7.5)

## 2018-08-07 LAB — LIPID PANEL
Chol/HDL Ratio: 5.5 ratio — ABNORMAL HIGH (ref 0.0–4.4)
Cholesterol, Total: 208 mg/dL — ABNORMAL HIGH (ref 100–199)
HDL: 38 mg/dL — ABNORMAL LOW (ref >39)
LDL Calculated: 145 mg/dL — ABNORMAL HIGH (ref 0–99)
Triglycerides: 124 mg/dL (ref 0–149)
VLDL Cholesterol Cal: 25 mg/dL (ref 5–40)

## 2018-08-07 LAB — TSH: TSH: 1.06 u[IU]/mL (ref 0.450–4.500)

## 2018-08-14 MED ORDER — VITAMIN D (ERGOCALCIFEROL) 1.25 MG (50000 UNIT) PO CAPS: 50000.0000 [IU] | ORAL_CAPSULE | ORAL | 0 refills | Status: DC

## 2018-08-14 NOTE — Addendum Note (Signed)
Addended by: Rutherford Guys on: 08/14/2018 09:31 PM   Modules accepted: Orders

## 2018-08-15 NOTE — Progress Notes (Signed)
Spoke with pt, says she will be working on the a1c so that she will be out of pre dm range

## 2018-08-16 ENCOUNTER — Ambulatory Visit: Payer: Managed Care, Other (non HMO) | Admitting: Orthopaedic Surgery

## 2018-08-22 ENCOUNTER — Encounter: Payer: Self-pay | Admitting: Orthopaedic Surgery

## 2018-08-22 ENCOUNTER — Ambulatory Visit (INDEPENDENT_AMBULATORY_CARE_PROVIDER_SITE_OTHER): Payer: Managed Care, Other (non HMO) | Admitting: Orthopaedic Surgery

## 2018-08-22 ENCOUNTER — Ambulatory Visit (INDEPENDENT_AMBULATORY_CARE_PROVIDER_SITE_OTHER): Payer: Managed Care, Other (non HMO)

## 2018-08-22 ENCOUNTER — Other Ambulatory Visit: Payer: Self-pay

## 2018-08-22 DIAGNOSIS — M25551 Pain in right hip: Secondary | ICD-10-CM | POA: Diagnosis not present

## 2018-08-22 DIAGNOSIS — M25552 Pain in left hip: Secondary | ICD-10-CM

## 2018-08-22 DIAGNOSIS — M542 Cervicalgia: Secondary | ICD-10-CM

## 2018-08-22 DIAGNOSIS — M1611 Unilateral primary osteoarthritis, right hip: Secondary | ICD-10-CM
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
## 2018-08-22 NOTE — Progress Notes (Signed)
Subjective: Patient is here for ultrasound-guided intra-articular right hip injection.  Objective:  Pain with passive IR.  Procedure: Ultrasound-guided right hip injection: After sterile prep with Betadine, injected 8 cc 1% lidocaine without epinephrine and 40 mg methylprednisolone using a 22-gauge spinal needle, passing the needle through the iliofemoral ligament into the femoral head/neck junction.  injectate seen filling the joint capsule.  Good immediate relief.  RTC as directed with Dr. Erlinda Hong.

## 2018-08-27 ENCOUNTER — Ambulatory Visit: Payer: Managed Care, Other (non HMO) | Admitting: Family Medicine

## 2018-09-02 ENCOUNTER — Ambulatory Visit: Payer: Self-pay | Admitting: Family Medicine

## 2018-09-03 ENCOUNTER — Ambulatory Visit: Payer: Managed Care, Other (non HMO) | Admitting: Family Medicine

## 2018-09-05 ENCOUNTER — Ambulatory Visit: Payer: Managed Care, Other (non HMO) | Admitting: Orthopaedic Surgery

## 2018-09-13 ENCOUNTER — Telehealth: Payer: Self-pay | Admitting: Family Medicine

## 2018-09-13 NOTE — Telephone Encounter (Signed)
Spoke to patient and gave her the [facility] number

## 2018-09-17 ENCOUNTER — Telehealth: Payer: Self-pay | Admitting: Family Medicine

## 2018-09-17 NOTE — Telephone Encounter (Signed)
Pt called in to make provider and Sherron aware that she still has not been able to scheduled apt with [facility]. Pt says that when she got transferred to them she still didn't get through to a live person. Pt says that [facility] still has not returned her call to scheduled.   Pt would like to be advised on what she should do because she said that she is tired of trying.   Please assist.

## 2018-09-17 NOTE — Telephone Encounter (Signed)
[facility] [phone] has tried to call patient on multiple occasions, there is not a VM set up on the patients phone.

## 2018-09-17 NOTE — Telephone Encounter (Signed)
lvm

## 2018-09-18 NOTE — Telephone Encounter (Signed)
Pt called upset that she had to be transferred to me. Pt stated she had been trying to get up with our office since Monday. I apologized to the pt about the inconvenient. The pt stated that she did not appreciate my lying on her about her voicemail. Once again I apologized to the pt. She stated that the director of her PCP was able to get in contact with her so, she doesn't understand why I could not. I informed her I was unable to leave her a message. The pt stated that was a lie because, everyone else can leave her a message. I apologized to the pt again. The pt would not let me talk she keep interrupting me and the pt was very aggressive. The pt stated that she told her PCP she did not want to come here anyway so she don't know why they sent her here. The pt refused to scheduled the sleep consult all she wanted to do was tell me how I was lying on her.

## 2018-09-24 ENCOUNTER — Encounter: Payer: Self-pay | Admitting: Orthopaedic Surgery

## 2018-09-24 ENCOUNTER — Ambulatory Visit: Payer: Managed Care, Other (non HMO) | Admitting: Orthopaedic Surgery

## 2018-10-03 ENCOUNTER — Ambulatory Visit (INDEPENDENT_AMBULATORY_CARE_PROVIDER_SITE_OTHER): Payer: Managed Care, Other (non HMO) | Admitting: Family Medicine

## 2018-10-03 ENCOUNTER — Encounter: Payer: Self-pay | Admitting: Family Medicine

## 2018-10-03 ENCOUNTER — Telehealth: Payer: Self-pay

## 2018-10-03 ENCOUNTER — Other Ambulatory Visit: Payer: Self-pay

## 2018-10-03 VITALS — BP 140/90 | HR 60 | Temp 99.0°F | Ht 68.11 in | Wt 223.0 lb

## 2018-10-03 DIAGNOSIS — Z6833 Body mass index (BMI) 33.0-33.9, adult: Secondary | ICD-10-CM | POA: Diagnosis not present

## 2018-10-03 DIAGNOSIS — R7303 Prediabetes: Secondary | ICD-10-CM | POA: Diagnosis not present

## 2018-10-03 DIAGNOSIS — E559 Vitamin D deficiency, unspecified: Secondary | ICD-10-CM

## 2018-10-03 DIAGNOSIS — E7800 Pure hypercholesterolemia, unspecified: Secondary | ICD-10-CM

## 2018-10-03 DIAGNOSIS — I10 Essential (primary) hypertension: Secondary | ICD-10-CM

## 2018-10-03 NOTE — Telephone Encounter (Signed)
Doctor Pamella Pert. i would like to see her in 4 months with fasting labs in 3-4 days prior to appt. please schedule.
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
## 2018-10-03 NOTE — Patient Instructions (Signed)
° ° ° °  If you have lab work done today you will be contacted with your lab results within the next 2 weeks.  If you have not heard from us then please contact us. The fastest way to get your results is to register for My Chart. ° ° °IF you received an x-ray today, you will receive an invoice from [facility]. Please contact [facility] at [phone] with questions or concerns regarding your invoice.  ° °IF you received labwork today, you will receive an invoice from [facility]. Please contact [facility] at [phone] with questions or concerns regarding your invoice.  ° °Our billing staff will not be able to assist you with questions regarding bills from these companies. ° °You will be contacted with the lab results as soon as they are available. The fastest way to get your results is to activate your My Chart account. Instructions are located on the last page of this paperwork. If you have not heard from us regarding the results in 2 weeks, please contact this office. °  ° ° ° °

## 2018-10-07 ENCOUNTER — Ambulatory Visit: Payer: Managed Care, Other (non HMO) | Admitting: Registered Nurse

## 2018-12-11 ENCOUNTER — Encounter: Payer: Self-pay | Admitting: Orthopaedic Surgery

## 2018-12-11 ENCOUNTER — Ambulatory Visit (INDEPENDENT_AMBULATORY_CARE_PROVIDER_SITE_OTHER): Payer: Managed Care, Other (non HMO) | Admitting: Orthopaedic Surgery

## 2018-12-11 VITALS — Ht 68.11 in | Wt 223.0 lb

## 2018-12-11 DIAGNOSIS — M1611 Unilateral primary osteoarthritis, right hip: Secondary | ICD-10-CM

## 2018-12-11 MED ORDER — TRAMADOL HCL 50 MG PO TABS: 50.0000 mg | ORAL_TABLET | Freq: Every day | ORAL | 2 refills | Status: DC | PRN

## 2018-12-11 NOTE — Progress Notes (Signed)
Office Visit Note   Patient: Veronica Pollard           Date of Birth: 12/11/1970           MRN: [id number] Visit Date: 12/11/2018              Requested by: No referring provider defined for this encounter. PCP: Patient, No Pcp Per   Assessment & Plan: Visit Diagnoses:  1. Primary osteoarthritis of right hip     Plan: Impression is end-stage right hip DJD.  Again we had a detailed discussion on different treatment options and I think that since she is getting a good amount of relief from the hip injection that is worth doing another one.  I did review her x-rays again today and she does have fairly advanced DJD and so she does understand that a hip replacement is a strong probability within the next couple years.  In the meantime she will continue to exercise and lose weight.  Tramadol was prescribed today for breakthrough pain.  Questions encouraged and answered.  We will arrange for her to get another injection with Dr. Junius Roads.  Follow-Up Instructions: Return if symptoms worsen or fail to improve.   Orders:  No orders of the defined types were placed in this encounter.  Meds ordered this encounter  Medications  . traMADol (ULTRAM) 50 MG tablet    Sig: Take 1-2 tablets (50-100 mg total) by mouth daily as needed.    Dispense:  30 tablet    Refill:  2      Procedures: No procedures performed   Clinical Data: No additional findings.   Subjective: Chief Complaint  Patient presents with  . Right Hip - Pain    Veronica Pollard is a 48 year old female comes in for continued right hip pain secondary to DJD.  Her last cortisone injection was a little over 3 months ago which helped quite a bit and allow her to return to activities at the gym.  She takes ibuprofen as needed.   Review of Systems  Constitutional: Negative.   HENT: Negative.   Eyes: Negative.   Respiratory: Negative.   Cardiovascular: Negative.   Endocrine: Negative.   Musculoskeletal: Negative.   Neurological:  Negative.   Hematological: Negative.   Psychiatric/Behavioral: Negative.   All other systems reviewed and are negative.    Objective: Vital Signs: Ht 5' 8.11" (1.73 m)   Wt 223 lb (101.2 kg)   BMI 33.80 kg/m   Physical Exam Vitals signs and nursing note reviewed.  Constitutional:      Appearance: She is well-developed.  Pulmonary:     Effort: Pulmonary effort is normal.  Skin:    General: Skin is warm.     Capillary Refill: Capillary refill takes less than 2 seconds.  Neurological:     Mental Status: She is alert and oriented to person, place, and time.  Psychiatric:        Behavior: Behavior normal.        Thought Content: Thought content normal.        Judgment: Judgment normal.     Ortho Exam Right hip exam shows positive FADIR.  No tenderness palpation. Specialty Comments:  No specialty comments available.  Imaging: No results found.   PMFS History: Patient Active Problem List   Diagnosis Date Noted  . Prediabetes 10/03/2018  . BMI 33.0-33.9,adult 10/03/2018  . Bilateral hip pain 08/22/2018  . Neck pain 08/22/2018  . Unilateral primary osteoarthritis, right hip 12/04/2017  . HYPERCHOLESTEROLEMIA  05/25/2010  . FIBROIDS, UTERUS 05/12/2010  . EXTERNAL HEMORRHOIDS 05/12/2010  . IRRITABLE BOWEL SYNDROME 05/12/2010  . CALLUSES, LEFT FOOT 05/12/2010  . 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 CARPAL TUNNEL SYNDROME, BILATERAL, HX OF 05/12/2010  . OBESITY, NOS 05/17/2006  . ANEMIA, IRON DEFICIENCY, UNSPEC. 05/17/2006  . HYPERTENSION, BENIGN SYSTEMIC 05/17/2006   Past Medical History:  Diagnosis Date  . Hypertension   . Renal disorder    early stage kidney disease    Family History  Problem Relation Age of Onset  . Breast cancer Mother   . Breast cancer Maternal Aunt     Past Surgical History:  Procedure Laterality Date  . ABDOMINAL HYSTERECTOMY    . BREAST BIOPSY Right 2014   benign  . CARPAL TUNNEL RELEASE    . CHOLECYSTECTOMY     . HAMMER TOE SURGERY     Social History   Occupational History  . Not on file  Tobacco Use  . Smoking status: Heavy Tobacco Smoker    Packs/day: 0.50    Last attempt to quit: 05/07/2012    Years since quitting: 6.6  . Smokeless tobacco: Never Used  Substance and Sexual Activity  . Alcohol use: Yes    Comment: socially  . Drug use: No  . Sexual activity: Not on file

## 2018-12-12 ENCOUNTER — Other Ambulatory Visit: Payer: Self-pay | Admitting: Family Medicine

## 2018-12-12 NOTE — Telephone Encounter (Signed)
Requested medication (s) are due for refill today: yes  Requested medication (s) are on the active medication list: yes  Last refill:  11/13/2018  Future visit scheduled: no  Notes to clinic:  Refill cannot be delegated    Requested Prescriptions  Pending Prescriptions Disp Refills   Vitamin D, Ergocalciferol, (DRISDOL) 1.25 MG (50000 UT) CAPS capsule [Pharmacy Med Name: VITAMIN D2 50,000IU (ERGO) CAP RX] 16 capsule 0    Sig: TAKE ONE CAPSULE BY MOUTH EVERY 7 DAYS     Endocrinology:  Vitamins - Vitamin D Supplementation Failed - 12/12/2018 10:22 AM      Failed - 50,000 IU strengths are not delegated      Failed - Phosphate in normal range and within 360 days    Phosphorus  Date Value Ref Range Status  06/25/2017 4.0 2.5 - 4.5 mg/dL Final         Failed - Vitamin D in normal range and within 360 days    Vit D, 25-Hydroxy  Date Value Ref Range Status  08/06/2018 9.6 (L) 30.0 - 100.0 ng/mL Final    Comment:    Vitamin D deficiency has been defined by the Institute of Medicine and an Endocrine Society practice guideline as a level of serum 25-OH vitamin D less than 20 ng/mL (1,2). The Endocrine Society went on to further define vitamin D insufficiency as a level between 21 and 29 ng/mL (2). 1. IOM (Institute of Medicine). 2010. Dietary reference    intakes for calcium and D. [place]: The    [employer]. 2. Holick MF, Binkley Hopwood, Bischoff-Ferrari HA, et al.    Evaluation, treatment, and prevention of vitamin D    deficiency: an Endocrine Society clinical practice    guideline. JCEM. 2011 Jul; 96(7):1911-30.          Passed - Ca in normal range and within 360 days    Calcium  Date Value Ref Range Status  08/06/2018 9.3 8.7 - 10.2 mg/dL Final         Passed - Valid encounter within last 12 months    Recent Outpatient Visits          2 months ago HYPERTENSION, BENIGN SYSTEMIC   [facility], Lilia Argue, MD   4 months ago Essential  hypertension   [facility] at [facility], Arlie Solomons, MD   4 months ago Snoring   [facility], Lilia Argue, MD   5 months ago Essential hypertension   [facility], Ines Bloomer, MD   1 year ago Patient left without being seen   [facility] at [facility], PA-C

## 2018-12-17 ENCOUNTER — Ambulatory Visit (INDEPENDENT_AMBULATORY_CARE_PROVIDER_SITE_OTHER): Payer: Managed Care, Other (non HMO) | Admitting: Family Medicine

## 2018-12-17 ENCOUNTER — Encounter: Payer: Self-pay | Admitting: Family Medicine

## 2018-12-17 DIAGNOSIS — M1611 Unilateral primary osteoarthritis, right hip: Secondary | ICD-10-CM | POA: Diagnosis not present

## 2018-12-17 NOTE — Progress Notes (Signed)
Subjective: Patient is here for ultrasound-guided intra-articular right hip injection.   Good relief with last one until about 2 weeks ago.  Objective:  Pain with passive IR.  Procedure: Ultrasound-guided right hip injection: After sterile prep with Betadine, injected 8 cc 1% lidocaine without epinephrine and 40 mg methylprednisolone using a 22-gauge spinal needle, passing the needle through the iliofemoral ligament into the femoral head/neck junction.  Injectate seen filling joint capsule.  Return as needed.

## 2019-01-07 ENCOUNTER — Ambulatory Visit: Payer: Managed Care, Other (non HMO)

## 2019-02-19 ENCOUNTER — Ambulatory Visit
Admission: RE | Admit: 2019-02-19 | Discharge: 2019-02-19 | Disposition: A | Discharge status: Home / Self Care | Payer: Managed Care, Other (non HMO) | Source: Ambulatory Visit | Attending: Family Medicine | Admitting: Family Medicine

## 2019-02-19 ENCOUNTER — Other Ambulatory Visit: Payer: Self-pay

## 2019-02-19 DIAGNOSIS — Z1231 Encounter for screening mammogram for malignant neoplasm of breast: Secondary | ICD-10-CM

## 2019-02-20 ENCOUNTER — Other Ambulatory Visit: Payer: Self-pay | Admitting: Family Medicine

## 2019-02-20 DIAGNOSIS — R928 Other abnormal and inconclusive findings on diagnostic imaging of breast: Secondary | ICD-10-CM

## 2019-02-27 ENCOUNTER — Ambulatory Visit
Admission: RE | Admit: 2019-02-27 | Discharge: 2019-02-27 | Disposition: A | Discharge status: Home / Self Care | Payer: Managed Care, Other (non HMO) | Source: Ambulatory Visit | Attending: Family Medicine | Admitting: Family Medicine

## 2019-02-27 ENCOUNTER — Other Ambulatory Visit: Payer: Self-pay

## 2019-02-27 ENCOUNTER — Ambulatory Visit: Payer: Managed Care, Other (non HMO)

## 2019-02-27 DIAGNOSIS — R928 Other abnormal and inconclusive findings on diagnostic imaging of breast: Secondary | ICD-10-CM

## 2019-05-08 IMAGING — DX DG HAND COMPLETE 3+V*L*
3 series · 3 of 3 positions shown · non-contrast
Comparison: None

CLINICAL DATA: Fell down 3 steps this morning, felt like hip went
out, caught fall with LEFT hand, injury LEFT hand especially pain at
LEFT thumb

EXAM:
LEFT HAND - COMPLETE 3+ VIEW

[hand pa]
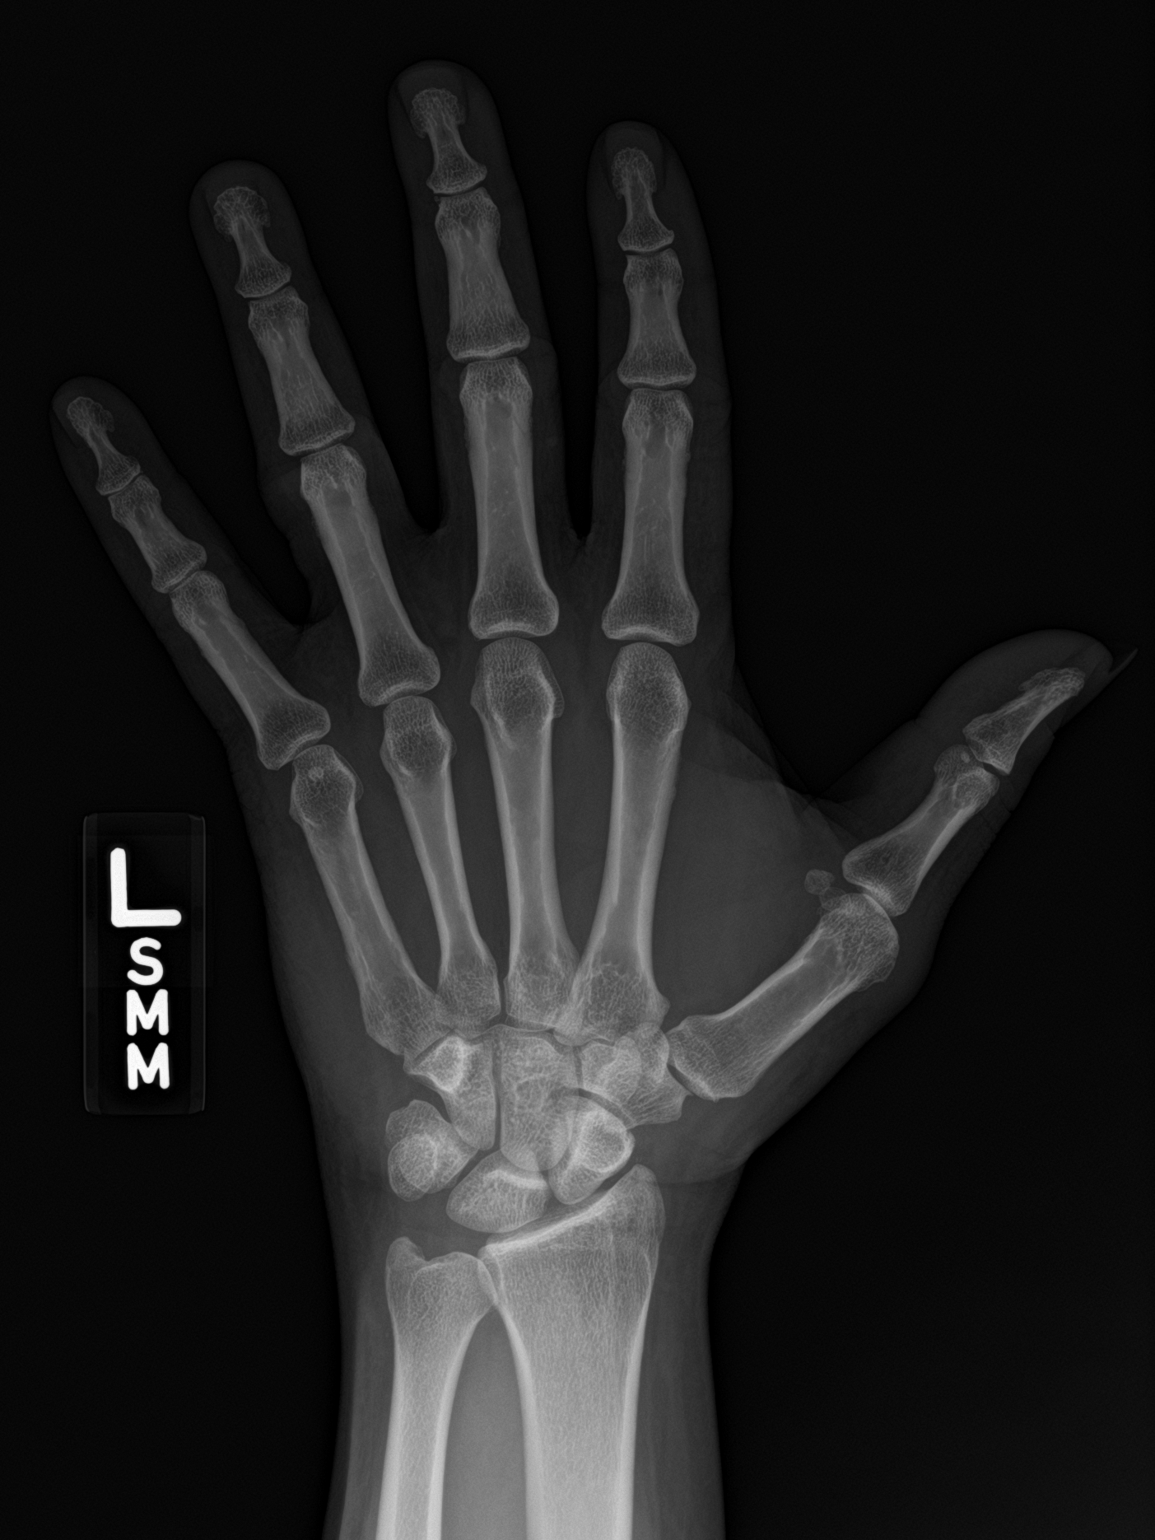

[hand obl]
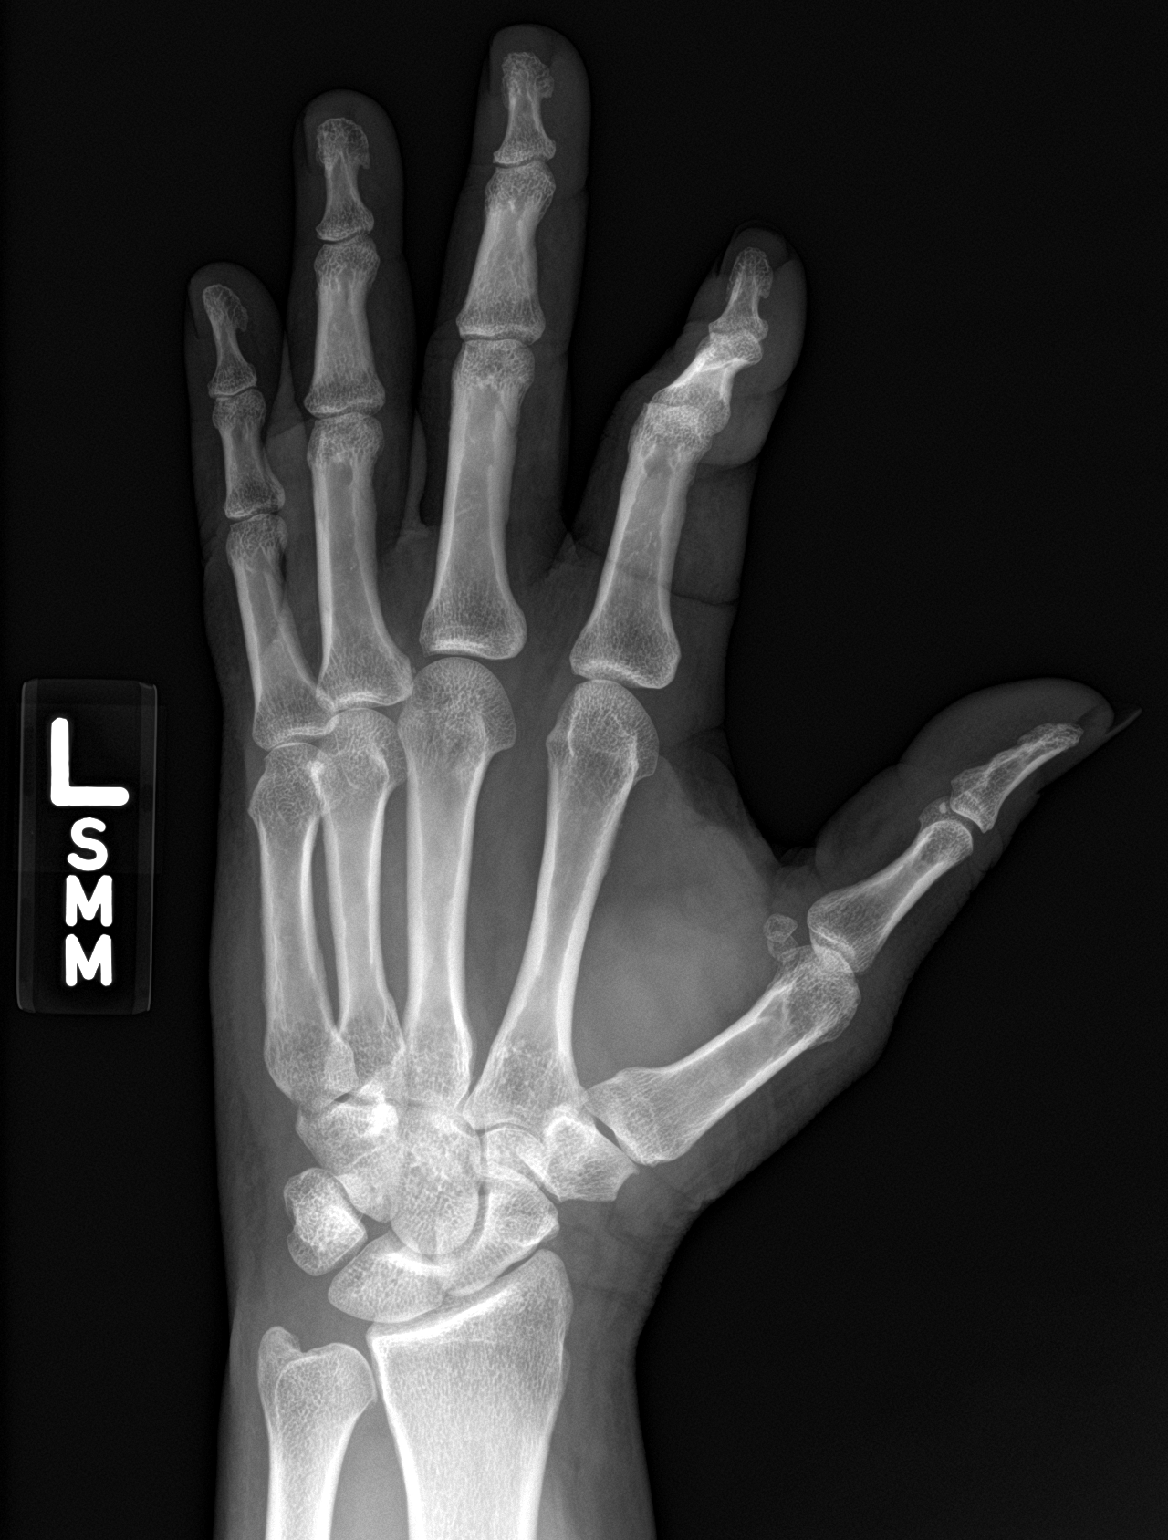

[hand lat]
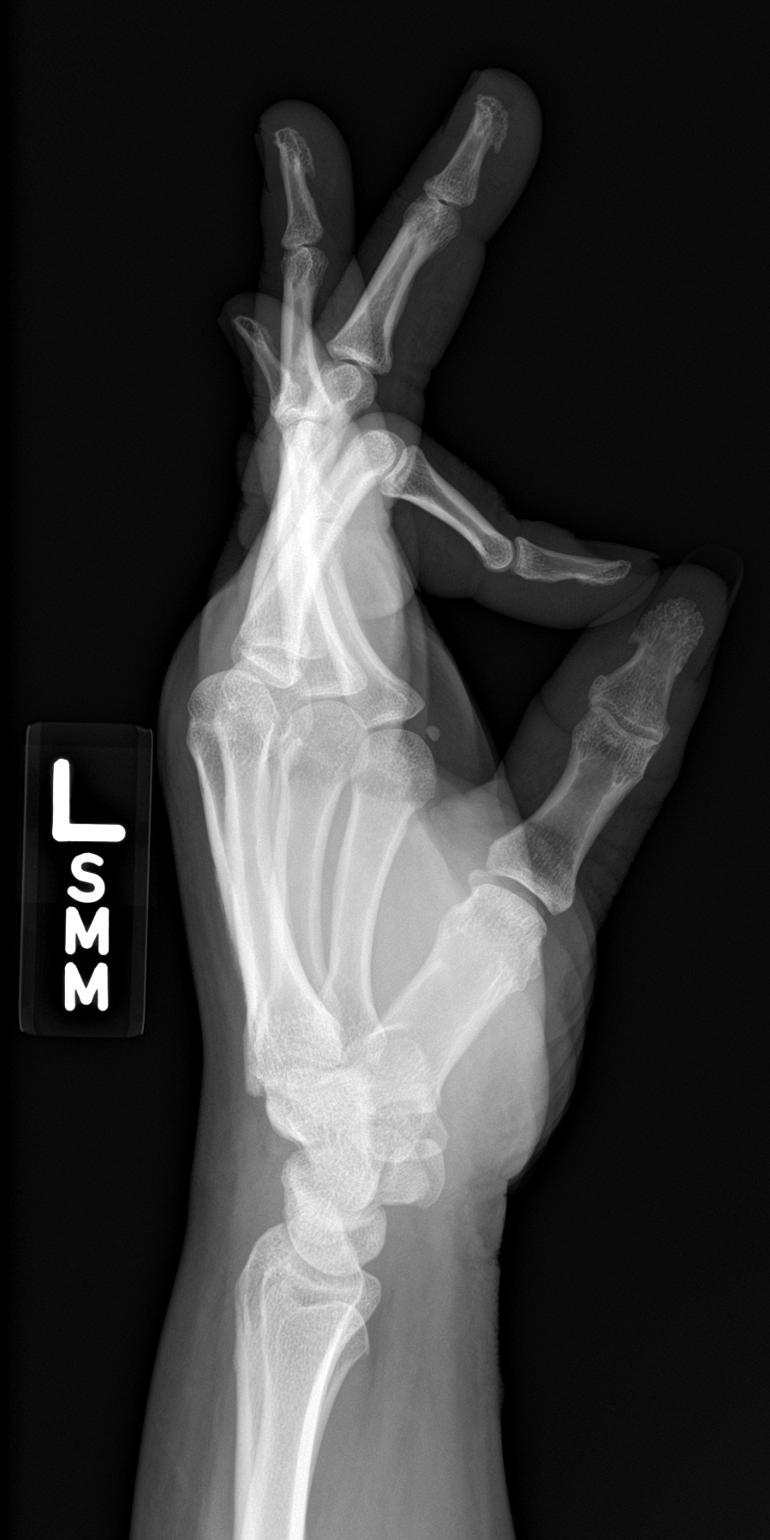

[3 of 3 positions shown; findings below may reference images not displayed]

FINDINGS: Osseous mineralization normal.

Joint spaces preserved.

No acute fracture, dislocation, or bone destruction.
IMPRESSION: No acute osseous abnormalities.

## 2019-06-19 ENCOUNTER — Other Ambulatory Visit: Payer: Self-pay

## 2019-06-19 ENCOUNTER — Telehealth: Payer: Self-pay | Admitting: Family Medicine

## 2019-06-19 DIAGNOSIS — E7800 Pure hypercholesterolemia, unspecified: Secondary | ICD-10-CM

## 2019-06-19 DIAGNOSIS — I10 Essential (primary) hypertension: Secondary | ICD-10-CM

## 2019-06-19 MED ORDER — METOPROLOL TARTRATE 50 MG PO TABS: 50.0000 mg | ORAL_TABLET | Freq: Every day | ORAL | 0 refills | Status: DC

## 2019-06-19 MED ORDER — PRAVASTATIN SODIUM 20 MG PO TABS: 20.0000 mg | ORAL_TABLET | Freq: Every day | ORAL | 0 refills | Status: DC

## 2019-06-19 NOTE — Telephone Encounter (Signed)
Pt would like a curtsey refill on her high blood pressure and cholesterol medications until her appt with provider on 4/20. Pt states she only had 6 pills left  Please advise. [phone]  metoprolol tartrate (LOPRESSOR) 50 MG tablet [[id number]]    pravastatin (PRAVACHOL) 20 MG tablet [[id number]]

## 2019-06-27 ENCOUNTER — Ambulatory Visit (INDEPENDENT_AMBULATORY_CARE_PROVIDER_SITE_OTHER): Payer: Managed Care, Other (non HMO)

## 2019-06-27 ENCOUNTER — Encounter: Payer: Self-pay | Admitting: Orthopaedic Surgery

## 2019-06-27 ENCOUNTER — Ambulatory Visit: Payer: Managed Care, Other (non HMO) | Admitting: Orthopaedic Surgery

## 2019-06-27 ENCOUNTER — Other Ambulatory Visit: Payer: Self-pay

## 2019-06-27 DIAGNOSIS — M25512 Pain in left shoulder: Secondary | ICD-10-CM

## 2019-06-27 DIAGNOSIS — M542 Cervicalgia: Secondary | ICD-10-CM

## 2019-06-27 DIAGNOSIS — G8929 Other chronic pain: Secondary | ICD-10-CM

## 2019-06-27 MED ORDER — PREDNISONE 10 MG (21) PO TBPK: ORAL_TABLET | ORAL | 0 refills | Status: DC

## 2019-06-27 MED ORDER — METHOCARBAMOL 500 MG PO TABS: 500.0000 mg | ORAL_TABLET | Freq: Two times a day (BID) | ORAL | 0 refills | Status: DC | PRN
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
## 2019-06-30 ENCOUNTER — Telehealth: Payer: Self-pay | Admitting: Radiology

## 2019-06-30 NOTE — Telephone Encounter (Signed)
Patient called triage line requesting directions for how to take the prednisone taper.  Patient advised.  She would also like a call back regarding working out. She wants to know if she should continue to work out at a decreased rate or if she could go back to working out fully. She wants to know if she could do 30 minutes on the elliptical and maybe use 10-15 lb weights?  I explained Ria Comment is in surgery and we would have to ask her what she recommends and call patient back.  CB for patient is [phone]

## 2019-06-30 NOTE — Telephone Encounter (Signed)
Take as instructed on package.  Usually 6 pills the first day, 5 the next and so on.  Not sure the exact spacing of pills though.  If not on package, call pharmacist.  Elliptical should be ok, but I would not lift any weights right now

## 2019-07-03 ENCOUNTER — Telehealth: Payer: Self-pay | Admitting: Orthopaedic Surgery

## 2019-07-03 NOTE — Telephone Encounter (Signed)
Patient called asked for a call back from Doctor Erlinda Hong because she need her MRI in [place] instead of being scheduled in [place]. Patient wanted doctor Erlinda Hong to know what she have been going through since Monday. Patient said she is in a lot of pain.  Patient said the answering service put her on hold for 20 minutes. The number contact patient is [phone] or [phone]

## 2019-07-03 NOTE — Telephone Encounter (Signed)
I just tried calling pt went straight to vm. Will try again at later time

## 2019-07-03 NOTE — Telephone Encounter (Signed)
Called patient no answer. See message below.

## 2019-07-03 NOTE — Telephone Encounter (Signed)
Pt called and margaret took the call and then  transferred her to me, I wanted to talk to pt about her imaging and why she was sent to [place] which is where her insurance wanted her to go because [place] imaging was OON and would be denied or they will contact pt to give a more cost effective facility which would be [facility]. When I sw the pt she was irate towards me saying she as been trying to call since Monday and has not heard from anyone on anything on several messages. I asked pt if there was something I can do to help her and she screamed at me saying "No I want to speak with Dr. Erlinda Hong", I advised her Dr. Erlinda Hong was not in but I can let his assistant Mendel Ryder talk to you and she stated "no I do not want to talk to his freaking PA I want to talk to him" I asked her still if there is something I can help with and she said no I have tried calling you and leaving messages with you and will not call back wanting to let you know I do not want to go all the way to [place] to have my MRI whern there are numerous places in [place] who can do it. I informed pt I have called back an left message as well and sorry for the phone tag. She stated "well I dont have all day to sit around my phone waiting on a call and when I call back you are always away from you desk or on phone with another pt" I apologized and told her that's my job, that's what I do all day I am constantly on phone with either insurance or another pt. Pt started hollering at me, I asked her to please calm down she got furious with me so I told her I will transfer her to my manager and she can talk with her about the problem she is having, I accidentally hung up on her when I was suppose to have hit the hold button.

## 2019-07-03 NOTE — Telephone Encounter (Signed)
Patient called. She would like for Dr. Erlinda Hong to call her concerning her MRI. Her call back number is [phone]

## 2019-07-04 NOTE — Telephone Encounter (Signed)
Order has been sent to [facility] per request by Rogelia Boga, MW will contact pt to schedule appt

## 2019-07-08 ENCOUNTER — Ambulatory Visit: Payer: Managed Care, Other (non HMO) | Admitting: Family Medicine

## 2019-07-08 ENCOUNTER — Encounter: Payer: Self-pay | Admitting: Family Medicine

## 2019-07-08 ENCOUNTER — Other Ambulatory Visit: Payer: Self-pay

## 2019-07-08 VITALS — BP 160/90 | HR 72 | Temp 98.7°F | Ht 68.11 in | Wt 226.0 lb

## 2019-07-08 DIAGNOSIS — E7800 Pure hypercholesterolemia, unspecified: Secondary | ICD-10-CM

## 2019-07-08 DIAGNOSIS — I10 Essential (primary) hypertension: Secondary | ICD-10-CM | POA: Diagnosis not present

## 2019-07-08 DIAGNOSIS — M25552 Pain in left hip: Secondary | ICD-10-CM

## 2019-07-08 DIAGNOSIS — R7303 Prediabetes: Secondary | ICD-10-CM | POA: Diagnosis not present

## 2019-07-08 DIAGNOSIS — M25551 Pain in right hip: Secondary | ICD-10-CM | POA: Diagnosis not present

## 2019-07-08 DIAGNOSIS — R351 Nocturia: Secondary | ICD-10-CM

## 2019-07-08 DIAGNOSIS — E559 Vitamin D deficiency, unspecified: Secondary | ICD-10-CM

## 2019-07-08 DIAGNOSIS — E8941 Symptomatic postprocedural ovarian failure: Secondary | ICD-10-CM

## 2019-07-08 MED ORDER — GABAPENTIN 300 MG PO CAPS: 300.0000 mg | ORAL_CAPSULE | Freq: Every day | ORAL | 3 refills | Status: DC

## 2019-07-08 NOTE — Progress Notes (Signed)
4/20/20213:44 PM  Veronica Pollard [birth year], 49 y.o., female [id number]  Chief Complaint  Patient presents with  . Sleep Apnea    referral to sleep studies  . orthopedic    neck pain and hip surgery Dr Erlinda Hong     HPI:   Patient is a 49 y.o. female with past medical history significant for HTN, prediabetes, vitamin D deficiency, HLP, OA hips, cervical DDD who presents today for routine followup  Last OV July 2020 Saw ortho last week - started on sterapred taper and muscle relaxant, ordered MRI for neck pain with radiculopathy She will be getting steroids injection into hip yesterday She is not tolerating oral steroids, not sleeping well, hot flashes are worse, had partial hysterectomy, ovaries left She is most likely will be getting hip replacement She is constant pain Melatonin is not working, she is tossing all night, she wakes up about 6 x night to urinate, no issues during the day She does not fall asleep during the day She is not sure if she snores She is requesting referral to eval for sleep apnea Pain is better if she exercises She has not taken BP med today  Depression screen [facility] 2/9 07/08/2019 10/03/2018 08/01/2018  Decreased Interest 0 0 3  Down, Depressed, Hopeless 0 - 3  PHQ - 2 Score 0 0 6  Altered sleeping - - 3  Tired, decreased energy - - 3  Change in appetite - - 3  Feeling bad or failure about yourself  - - 3  Trouble concentrating - - 3  Moving slowly or fidgety/restless - - 3  Suicidal thoughts - - 3  PHQ-9 Score - - 27    Fall Risk  07/08/2019 10/03/2018 08/01/2018 07/01/2018 06/25/2017  Falls in the past year? 0 0 0 1 Yes  Number falls in past yr: 0 0 0 1 1  Injury with Fall? 0 0 0 1 No  Comment - - - left thumb and arm -  Follow up Falls evaluation completed - - Falls evaluation completed -     Allergies  Allergen Reactions  . Penicillins     REACTION: Swollen throat, difficult breathing  . Penicillins     Prior to Admission medications     Medication Sig Start Date End Date Taking? Authorizing Provider  metoprolol tartrate (LOPRESSOR) 50 MG tablet Take 1 tablet (50 mg total) by mouth daily. 06/19/19  Yes Rutherford Guys, MD  pravastatin (PRAVACHOL) 20 MG tablet Take 1 tablet (20 mg total) by mouth daily. 06/19/19  Yes Rutherford Guys, MD  HYDROcodone-acetaminophen (NORCO) 5-325 MG tablet Take 1 tablet by mouth every 6 (six) hours as needed for moderate pain. Patient not taking: Reported on 07/08/2019 05/08/18   Robyn Haber, MD  methocarbamol (ROBAXIN) 500 MG tablet Take 1 tablet (500 mg total) by mouth 2 (two) times daily as needed. Patient not taking: Reported on 07/08/2019 06/27/19   Aundra Dubin, PA-C  predniSONE (STERAPRED UNI-PAK 21 TAB) 10 MG (21) TBPK tablet Take as directed Patient not taking: Reported on 07/08/2019 06/27/19   Aundra Dubin, PA-C  traMADol (ULTRAM) 50 MG tablet Take 1-2 tablets (50-100 mg total) by mouth daily as needed. Patient not taking: Reported on 07/08/2019 12/11/18   Leandrew Koyanagi, MD  Vitamin D, Ergocalciferol, (DRISDOL) 1.25 MG (50000 UT) CAPS capsule TAKE ONE CAPSULE BY MOUTH EVERY 7 DAYS Patient not taking: Reported on 07/08/2019 12/12/18   Rutherford Guys, MD    Past Medical History:  Diagnosis Date  . Hypertension   . Renal disorder    early stage kidney disease    Past Surgical History:  Procedure Laterality Date  . ABDOMINAL HYSTERECTOMY    . BREAST BIOPSY Right 2014   benign  . CARPAL TUNNEL RELEASE    . CHOLECYSTECTOMY    . HAMMER TOE SURGERY      Social History   Tobacco Use  . Smoking status: Heavy Tobacco Smoker    Packs/day: 0.50    Last attempt to quit: 05/07/2012    Years since quitting: 7.1  . Smokeless tobacco: Never Used  Substance Use Topics  . Alcohol use: Yes    Comment: socially    Family History  Problem Relation Age of Onset  . Breast cancer Mother   . Breast cancer Maternal Aunt     Review of Systems  Constitutional: Negative for chills and  fever.  Respiratory: Negative for cough and shortness of breath.   Cardiovascular: Negative for chest pain, palpitations and leg swelling.  Gastrointestinal: Negative for abdominal pain, nausea and vomiting.  per hpi   OBJECTIVE:  Today's Vitals   07/08/19 1534 07/08/19 1544  BP: (!) 185/90 (!) 160/90  Pulse: 72   Temp: 98.7 F (37.1 C)   SpO2: 99%   Weight: 226 lb (102.5 kg)   Height: 5' 8.11" (1.73 m)    Body mass index is 34.25 kg/m.   Wt Readings from Last 3 Encounters:  07/08/19 226 lb (102.5 kg)  12/11/18 223 lb (101.2 kg)  10/03/18 223 lb (101.2 kg)    Physical Exam Vitals and nursing note reviewed.  Constitutional:      Appearance: She is well-developed.  HENT:     Head: Normocephalic and atraumatic.     Mouth/Throat:     Pharynx: No oropharyngeal exudate.  Eyes:     General: No scleral icterus.    Conjunctiva/sclera: Conjunctivae normal.     Pupils: Pupils are equal, round, and reactive to light.  Cardiovascular:     Rate and Rhythm: Normal rate and regular rhythm.     Heart sounds: Normal heart sounds. No murmur. No friction rub. No gallop.   Pulmonary:     Effort: Pulmonary effort is normal.     Breath sounds: Normal breath sounds. No wheezing or rales.  Musculoskeletal:     Cervical back: Neck supple.     Right lower leg: No edema.     Left lower leg: No edema.  Skin:    General: Skin is warm and dry.  Neurological:     Mental Status: She is alert and oriented to person, place, and time.     No results found for this or any previous visit (from the past 24 hour(s)).  No results found.   ASSESSMENT and PLAN  1. Prediabetes Checking labs today, medications will be adjusted as needed.  - Hemoglobin A1c  2. HYPERCHOLESTEROLEMIA Checking labs today, medications will be adjusted as needed.  - Lipid panel - CMP14+EGFR  3. HYPERTENSION, BENIGN SYSTEMIC Not controlled as has not taking meds today. Previous BP at goal. Re-eval at next  OV.  4. Bilateral hip pain Under care of ortho  5. Nocturia Discussed low suspicion of OSA but referral made as requested. If no OSA, discussed trial of meds for OAB - Urinalysis, Routine w reflex microscopic - Ambulatory referral to Sleep Studies  6. Hot flashes due to surgical menopause Trial of gabapentin, reviewed r/se/b  7. Vitamin D deficiency - VITAMIN D  25 Hydroxy (Vit-D Deficiency, Fractures)  Other orders - gabapentin (NEURONTIN) 300 MG capsule; Take 1-2 capsules (300-600 mg total) by mouth at bedtime.  Return in about 3 months (around 10/07/2019).    Rutherford Guys, MD [facility] at [facility] [place], [place] [address] Ph.  [phone] Fax [phone]

## 2019-07-08 NOTE — Patient Instructions (Signed)
° ° ° °  If you have lab work done today you will be contacted with your lab results within the next 2 weeks.  If you have not heard from us then please contact us. The fastest way to get your results is to register for My Chart. ° ° °IF you received an x-ray today, you will receive an invoice from [facility]. Please contact [facility] at [phone] with questions or concerns regarding your invoice.  ° °IF you received labwork today, you will receive an invoice from [facility]. Please contact [facility] at [phone] with questions or concerns regarding your invoice.  ° °Our billing staff will not be able to assist you with questions regarding bills from these companies. ° °You will be contacted with the lab results as soon as they are available. The fastest way to get your results is to activate your My Chart account. Instructions are located on the last page of this paperwork. If you have not heard from us regarding the results in 2 weeks, please contact this office. °  ° ° ° °

## 2019-07-09 ENCOUNTER — Encounter: Payer: Self-pay | Admitting: Family Medicine

## 2019-07-09 ENCOUNTER — Other Ambulatory Visit: Payer: Self-pay

## 2019-07-09 ENCOUNTER — Ambulatory Visit: Payer: Self-pay

## 2019-07-09 ENCOUNTER — Ambulatory Visit: Payer: Managed Care, Other (non HMO) | Admitting: Family Medicine

## 2019-07-09 DIAGNOSIS — M1611 Unilateral primary osteoarthritis, right hip: Secondary | ICD-10-CM

## 2019-07-09 LAB — MICROSCOPIC EXAMINATION
Casts: NONE SEEN /lpf
WBC, UA: NONE SEEN /hpf (ref 0–5)

## 2019-07-09 LAB — VITAMIN D 25 HYDROXY (VIT D DEFICIENCY, FRACTURES): Vit D, 25-Hydroxy: 19.7 ng/mL — ABNORMAL LOW (ref 30.0–100.0)

## 2019-07-09 LAB — CMP14+EGFR
ALT: 7 IU/L (ref 0–32)
AST: 11 IU/L (ref 0–40)
Albumin/Globulin Ratio: 1.3 (ref 1.2–2.2)
Albumin: 4.1 g/dL (ref 3.8–4.8)
Alkaline Phosphatase: 71 IU/L (ref 39–117)
BUN/Creatinine Ratio: 10 (ref 9–23)
BUN: 9 mg/dL (ref 6–24)
Bilirubin Total: 0.4 mg/dL (ref 0.0–1.2)
CO2: 25 mmol/L (ref 20–29)
Calcium: 9 mg/dL (ref 8.7–10.2)
Chloride: 103 mmol/L (ref 96–106)
Creatinine, Ser: 0.88 mg/dL (ref 0.57–1.00)
GFR calc Af Amer: 89 mL/min/{1.73_m2} (ref >59)
GFR calc non Af Amer: 77 mL/min/{1.73_m2} (ref >59)
Globulin, Total: 3.1 g/dL (ref 1.5–4.5)
Glucose: 95 mg/dL (ref 65–99)
Potassium: 3.4 mmol/L — ABNORMAL LOW (ref 3.5–5.2)
Sodium: 143 mmol/L (ref 134–144)
Total Protein: 7.2 g/dL (ref 6.0–8.5)

## 2019-07-09 LAB — URINALYSIS, ROUTINE W REFLEX MICROSCOPIC
Bilirubin, UA: NEGATIVE
Glucose, UA: NEGATIVE
Leukocytes,UA: NEGATIVE
Nitrite, UA: NEGATIVE
RBC, UA: NEGATIVE
Specific Gravity, UA: >=1.03 — AB (ref 1.005–1.030)
Urobilinogen, Ur: 1 mg/dL (ref 0.2–1.0)
pH, UA: 6.5 (ref 5.0–7.5)

## 2019-07-09 LAB — LIPID PANEL
Chol/HDL Ratio: 4.2 ratio (ref 0.0–4.4)
Cholesterol, Total: 208 mg/dL — ABNORMAL HIGH (ref 100–199)
HDL: 49 mg/dL (ref >39)
LDL Chol Calc (NIH): 141 mg/dL — ABNORMAL HIGH (ref 0–99)
Triglycerides: 102 mg/dL (ref 0–149)
VLDL Cholesterol Cal: 18 mg/dL (ref 5–40)

## 2019-07-09 LAB — HEMOGLOBIN A1C
Est. average glucose Bld gHb Est-mCnc: 114 mg/dL
Hgb A1c MFr Bld: 5.6 % (ref 4.8–5.6)

## 2019-07-09 NOTE — Progress Notes (Signed)
   Office Visit Note   Patient: Veronica Pollard           Date of Birth: [birth year]           MRN: [id number] Visit Date: 07/09/2019 Requested by: Rutherford Guys, MD [address] [place],  [place] [address] PCP: Rutherford Guys, MD  Subjective: Chief Complaint  Patient presents with  . Right Hip - Pain    Would like another US-guided cortisone injection. Last one was 12/08/2019. Was given oral prednisone by Mendel Ryder last week, but she did not finish this due to symptoms.    HPI: She is here with recurrent right hip pain.  The last injection did not help as much as usual.  She would like to have another one, but she feels like she is ready to proceed with hip replacement.               ROS:   All other systems were reviewed and are negative.  Objective: Vital Signs: There were no vitals taken for this visit.  Physical Exam:  General:  Alert and oriented, in no acute distress. Pulm:  Breathing unlabored. Psy:  Normal mood, congruent affect  Right hip:  Pain with IR.  Imaging: US Guided Needle Placement  Result Date: 07/09/2019 Procedure: Ultrasound-guided right hip injection: After sterile prep with Betadine, injected 8 cc 1% lidocaine without epinephrine and 40 mg methylprednisolone using a 22-gauge spinal needle, passing the needle through the iliofemoral ligament into the femoral head/neck junction.  Injectate seen filling joint capsule.     Assessment & Plan: 1.  Right hip osteoarthritis -Injection completed under ultrasound guidance.  She will follow up with Dr. Erlinda Hong in the near future to discuss surgical intervention.     Procedures: No procedures performed  No notes on file     PMFS History: Patient Active Problem List   Diagnosis Date Noted  . Prediabetes 10/03/2018  . BMI 33.0-33.9,adult 10/03/2018  . Bilateral hip pain 08/22/2018  . Neck pain 08/22/2018  . Unilateral primary osteoarthritis, right hip 12/04/2017  . HYPERCHOLESTEROLEMIA 05/25/2010  .  FIBROIDS, UTERUS 05/12/2010  . EXTERNAL HEMORRHOIDS 05/12/2010  . IRRITABLE BOWEL SYNDROME 05/12/2010  . CALLUSES, LEFT FOOT 05/12/2010  . 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 CARPAL TUNNEL SYNDROME, BILATERAL, HX OF 05/12/2010  . OBESITY, NOS 05/17/2006  . ANEMIA, IRON DEFICIENCY, UNSPEC. 05/17/2006  . HYPERTENSION, BENIGN SYSTEMIC 05/17/2006   Past Medical History:  Diagnosis Date  . Hypertension   . Renal disorder    early stage kidney disease    Family History  Problem Relation Age of Onset  . Breast cancer Mother   . Breast cancer Maternal Aunt     Past Surgical History:  Procedure Laterality Date  . ABDOMINAL HYSTERECTOMY    . BREAST BIOPSY Right 2014   benign  . CARPAL TUNNEL RELEASE    . CHOLECYSTECTOMY    . HAMMER TOE SURGERY     Social History   Occupational History  . Not on file  Tobacco Use  . Smoking status: Heavy Tobacco Smoker    Packs/day: 0.50    Last attempt to quit: 05/07/2012    Years since quitting: 7.1  . Smokeless tobacco: Never Used  Substance and Sexual Activity  . Alcohol use: Yes    Comment: socially  . Drug use: No  . Sexual activity: Not on file

## 2019-07-15 ENCOUNTER — Ambulatory Visit: Payer: Managed Care, Other (non HMO) | Admitting: Orthopaedic Surgery

## 2019-07-17 ENCOUNTER — Other Ambulatory Visit: Payer: Self-pay | Admitting: Family Medicine

## 2019-07-17 DIAGNOSIS — E7800 Pure hypercholesterolemia, unspecified: Secondary | ICD-10-CM

## 2019-07-17 DIAGNOSIS — I10 Essential (primary) hypertension: Secondary | ICD-10-CM

## 2019-07-17 NOTE — Telephone Encounter (Signed)
Per last OV- patient to follow up July/21

## 2019-07-18 ENCOUNTER — Encounter: Payer: Self-pay | Admitting: Orthopaedic Surgery

## 2019-07-18 ENCOUNTER — Ambulatory Visit: Payer: Managed Care, Other (non HMO) | Admitting: Orthopaedic Surgery

## 2019-07-18 ENCOUNTER — Other Ambulatory Visit: Payer: Self-pay

## 2019-07-18 DIAGNOSIS — M5412 Radiculopathy, cervical region: Secondary | ICD-10-CM

## 2019-07-18 DIAGNOSIS — M1611 Unilateral primary osteoarthritis, right hip: Secondary | ICD-10-CM | POA: Diagnosis not present

## 2019-07-18 NOTE — Progress Notes (Signed)
Office Visit Note   Patient: Veronica Pollard           Date of Birth: 12-24-1970           MRN: [id number] Visit Date: 07/18/2019              Requested by: Rutherford Guys, MD [address] [place],  [place] [address] PCP: Rutherford Guys, MD   Assessment & Plan: Visit Diagnoses:  1. Primary localized osteoarthritis of right hip   2. Radiculopathy of cervical spine     Plan: Impression is cervical spine radiculopathy left upper extremity and advanced degenerative joint disease right hip.  In regards to her neck, we will start her in physical therapy.  External referral has been sent in.  If she does not have great relief of symptoms, we will likely refer her to Dr. Ernestina Patches for [facility].  In regards to her right hip, we have discussed hip replacement in detail.  He would like to follow-up with [place] once her injection starts to wear off to further discuss this.  Call with concerns or questions anytime.  Follow-Up Instructions: Return if symptoms worsen or fail to improve.   Orders:  Orders Placed This Encounter  Procedures  . Ambulatory referral to Physical Therapy   No orders of the defined types were placed in this encounter.     Procedures: No procedures performed   Clinical Data: No additional findings.   Subjective: Chief Complaint  Patient presents with  . Left Shoulder - Pain    HPI patient is a pleasant 49 year old female who comes in today to discuss MRI results of the cervical spine.  She has been having neck pain and left upper extremity radiculopathy for quite some time.  She has failed steroids and muscle relaxers and thus MRI of the cervical spine was obtained.  MRI shows mild multilevel spondylosis with mild spinal stenosis at C4-5 and C5-6 as well as mild left C3 and 5 foraminal stenosis with mild bilateral C 6 and C7 foraminal narrowing related to disc bulge.  She also has mild to moderate right C8 foraminal stenosis.  The other issue she brings up today is  continued right hip pain.  History of degenerative joint disease having had a few cortisone injections with mild to moderate relief of symptoms.  She is almost ready to proceed with hip replacement at this point.  Review of Systems as detailed in HPI.  All others reviewed and are negative.   Objective: Vital Signs: There were no vitals taken for this visit.  Physical Exam well-developed well-nourished female no acute distress.  Alert and oriented x3.  Ortho Exam stable exam of the lumbar spine.  Examination of her right hip reveals positive logroll.  Negative straight leg raise.  She is neurovascular intact distally.  Specialty Comments:  No specialty comments available.  Imaging: No new imaging   PMFS History: Patient Active Problem List   Diagnosis Date Noted  . Prediabetes 10/03/2018  . BMI 33.0-33.9,adult 10/03/2018  . Bilateral hip pain 08/22/2018  . Neck pain 08/22/2018  . Unilateral primary osteoarthritis, right hip 12/04/2017  . HYPERCHOLESTEROLEMIA 05/25/2010  . FIBROIDS, UTERUS 05/12/2010  . EXTERNAL HEMORRHOIDS 05/12/2010  . IRRITABLE BOWEL SYNDROME 05/12/2010  . CALLUSES, LEFT FOOT 05/12/2010  . 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 CARPAL TUNNEL SYNDROME, BILATERAL, HX OF 05/12/2010  . OBESITY, NOS 05/17/2006  . ANEMIA, IRON DEFICIENCY, UNSPEC. 05/17/2006  . HYPERTENSION, BENIGN SYSTEMIC 05/17/2006   Past  Medical History:  Diagnosis Date  . Hypertension   . Renal disorder    early stage kidney disease    Family History  Problem Relation Age of Onset  . Breast cancer Mother   . Breast cancer Maternal Aunt     Past Surgical History:  Procedure Laterality Date  . ABDOMINAL HYSTERECTOMY    . BREAST BIOPSY Right 2014   benign  . CARPAL TUNNEL RELEASE    . CHOLECYSTECTOMY    . HAMMER TOE SURGERY     Social History   Occupational History  . Not on file  Tobacco Use  . Smoking status: Heavy Tobacco Smoker    Packs/day: 0.50     Last attempt to quit: 05/07/2012    Years since quitting: 7.2  . Smokeless tobacco: Never Used  Substance and Sexual Activity  . Alcohol use: Yes    Comment: socially  . Drug use: No  . Sexual activity: Not on file

## 2019-07-23 ENCOUNTER — Encounter: Payer: Self-pay | Admitting: Neurology

## 2019-07-23 ENCOUNTER — Ambulatory Visit: Payer: Managed Care, Other (non HMO) | Admitting: Neurology

## 2019-07-23 ENCOUNTER — Other Ambulatory Visit: Payer: Self-pay

## 2019-07-23 VITALS — BP 152/104 | HR 72 | Temp 97.5°F | Ht 68.0 in | Wt 226.0 lb

## 2019-07-23 DIAGNOSIS — Z820 Family history of epilepsy and other diseases of the nervous system: Secondary | ICD-10-CM

## 2019-07-23 DIAGNOSIS — R351 Nocturia: Secondary | ICD-10-CM

## 2019-07-23 DIAGNOSIS — E669 Obesity, unspecified: Secondary | ICD-10-CM

## 2019-07-23 DIAGNOSIS — R519 Headache, unspecified: Secondary | ICD-10-CM | POA: Diagnosis not present

## 2019-07-23 DIAGNOSIS — R0683 Snoring: Secondary | ICD-10-CM

## 2019-07-23 NOTE — Progress Notes (Signed)
Subjective:    Patient ID: Veronica Pollard is a 49 y.o. female.  HPI     Star Age, MD, PhD [facility] [address], [address] [place], [place] [address]  Dear Dr. Pamella Pert,  I saw your patient, Veronica Pollard, upon your kind request in my sleep clinic today for initial consultation of her sleep disorder, in particular, concern for underlying obstructive sleep apnea.  The patient is unaccompanied today.  As you know, Veronica Pollard is a 49 year old right-handed Pollard with an underlying medical history of Prediabetes, osteoarthritis of both hips, cervical degenerative disc disease, hyperlipidemia, hypertension, vitamin D deficiency and obesity, who reports snoring and sleep disruption, particularly, nocturia which is often 3 or 4 times a night.  She also suffers from hot flashes at night which further disturb her sleep.  She has woken up with a headache, typically when she has neck pain.  She has been seeing doctor Xu for her arthritis and has received steroid injections, recently into the right hip under Dr. Junius Roads.  She may need hip replacement surgeries.  She has difficulty maintaining sleep, and has been taking gabapentin at night.  She also has significant bruxism and has tried an over-the-counter bite guard which did not help and she stopped using it. I reviewed your office note from 07/08/2019.  Her Epworth sleepiness score is 2 out of 24, fatigue severity score is 9 out of 63.  Her older sister has sleep apnea and uses a CPAP machine.  Patient suspects that at least another sister which is her youngest sister may have sleep apnea as well.  Her father snored loudly.   Her husband sleeps in a separate bedroom because of his snoring and sleep disruption.  She does not work.  Bedtime is generally around 1030 and rise time around 50 but she is often already awake before then.  She has 3 dogs in the household, 1 typically sleeps in the bed with her.  She has been working  on weight loss, her maximum weight was 400 pounds.  She goes to the gym regularly but has not been able to go as frequently because of hip pain and neck pain.  Her Past Medical History Is Significant For: Past Medical History:  Diagnosis Date  . High cholesterol   . Hypertension   . Osteoarthritis   . Renal disorder    early stage kidney disease    Her Past Surgical History Is Significant For: Past Surgical History:  Procedure Laterality Date  . ABDOMINAL HYSTERECTOMY    . BREAST BIOPSY Right 2014   benign  . CARPAL TUNNEL RELEASE    . CHOLECYSTECTOMY    . COLONOSCOPY    . HAMMER TOE SURGERY      Her Family History Is Significant For: Family History  Problem Relation Age of Onset  . Breast cancer Mother   . Breast cancer Maternal Aunt     Her Social History Is Significant For: Social History   Socioeconomic History  . Marital status: Married    Spouse name: Not on file  . Number of children: Not on file  . Years of education: Not on file  . Highest education level: Not on file  Occupational History  . Not on file  Tobacco Use  . Smoking status: Heavy Tobacco Smoker    Packs/day: 0.50    Last attempt to quit: 05/07/2012    Years since quitting: 7.2  . Smokeless tobacco: Never Used  Substance and Sexual  Activity  . Alcohol use: Yes    Comment: socially  . Drug use: No  . Sexual activity: Not on file  Other Topics Concern  . Not on file  Social History Narrative  . Not on file   Social Determinants of Health   Financial Resource Strain:   . Difficulty of Paying Living Expenses:   Food Insecurity:   . Worried About Charity fundraiser in the Last Year:   . Arboriculturist in the Last Year:   Transportation Needs:   . Film/video editor (Medical):   Marland Kitchen Lack of Transportation (Non-Medical):   Physical Activity:   . Days of Exercise per Week:   . Minutes of Exercise per Session:   Stress:   . Feeling of Stress :   Social Connections:   . Frequency  of Communication with Friends and Family:   . Frequency of Social Gatherings with Friends and Family:   . Attends Religious Services:   . Active Member of Clubs or Organizations:   . Attends Archivist Meetings:   Marland Kitchen Marital Status:     Her Allergies Are:  Allergies  Allergen Reactions  . Penicillins     REACTION: Swollen throat, difficult breathing  . Penicillins   :   Her Current Medications Are:  Outpatient Encounter Medications as of 07/23/2019  Medication Sig  . gabapentin (NEURONTIN) 300 MG capsule Take 1-2 capsules (300-600 mg total) by mouth at bedtime.  Marland Kitchen ibuprofen (ADVIL) 200 MG tablet Take 200 mg by mouth every 6 (six) hours as needed.  . metoprolol tartrate (LOPRESSOR) 50 MG tablet TAKE 1 TABLET(50 MG) BY MOUTH DAILY  . pravastatin (PRAVACHOL) 20 MG tablet TAKE 1 TABLET(20 MG) BY MOUTH DAILY  . [DISCONTINUED] HYDROcodone-acetaminophen (NORCO) 5-325 MG tablet Take 1 tablet by mouth every 6 (six) hours as needed for moderate pain. (Patient not taking: Reported on 07/08/2019)  . [DISCONTINUED] methocarbamol (ROBAXIN) 500 MG tablet Take 1 tablet (500 mg total) by mouth 2 (two) times daily as needed. (Patient not taking: Reported on 07/08/2019)  . [DISCONTINUED] predniSONE (STERAPRED UNI-PAK 21 TAB) 10 MG (21) TBPK tablet Take as directed (Patient not taking: Reported on 07/08/2019)  . [DISCONTINUED] traMADol (ULTRAM) 50 MG tablet Take 1-2 tablets (50-100 mg total) by mouth daily as needed. (Patient not taking: Reported on 07/08/2019)  . [DISCONTINUED] Vitamin D, Ergocalciferol, (DRISDOL) 1.25 MG (50000 UT) CAPS capsule TAKE ONE CAPSULE BY MOUTH EVERY 7 DAYS (Patient not taking: Reported on 07/08/2019)   No facility-administered encounter medications on file as of 07/23/2019.  :  Review of Systems:  Out of a complete 14 point review of systems, all are reviewed and negative with the exception of these symptoms as listed below: Review of Systems  Neurological:       Here for  sleep consult. No prior sleep study. She reports snoring is present.  Epworth Sleepiness Scale 0= would never doze 1= slight chance of dozing 2= moderate chance of dozing 3= high chance of dozing  Sitting and reading:1 Watching TV:1 Sitting inactive in a public place (ex. Theater or meeting):0 As a passenger in a car for an hour without a break:0 Lying down to rest in the afternoon:0 Sitting and talking to someone:0 Sitting quietly after lunch (no alcohol):0 In a car, while stopped in traffic:0 Total:2    Objective:  Neurological Exam  Physical Exam Physical Examination:   Vitals:   07/23/19 0741  BP: (!) 152/104  Pulse: 72  Temp: (!) 97.5 F (36.4 C)    General Examination: The patient is a very pleasant 49 y.o. female in no acute distress. She appears well-developed and well-nourished and well groomed.   HEENT: Normocephalic, atraumatic, pupils are equal, round and reactive to light, extraocular tracking is good without limitation to gaze excursion or nystagmus noted. Hearing is grossly intact. Face is symmetric with normal facial animation. Speech is clear with no dysarthria noted. There is no hypophonia. There is no lip, neck/head, jaw or voice tremor. Neck is supple with full range of passive and active motion. There are no carotid bruits on auscultation. Oropharynx exam reveals: mild mouth dryness, good dental hygiene and mild airway crowding, due to small airway entry and slightly larger appearing uvula, tongue protrudes centrally in palate elevates symmetrically, Mallampati class II, neck circumference of 16-1/4 inches, tonsils are 1+ in size, she has a mild overbite.  Chest: Clear to auscultation without wheezing, rhonchi or crackles noted.  Heart: S1+S2+0, regular and normal without murmurs, rubs or gallops noted.   Abdomen: Soft, non-tender and non-distended with normal bowel sounds appreciated on auscultation.  Extremities: There is no pitting edema in the  distal lower extremities bilaterally.   Skin: Warm and dry without trophic changes noted.   Musculoskeletal: exam reveals no obvious joint deformities, tenderness or joint swelling or erythema.   Neurologically:  Mental status: The patient is awake, alert and oriented in all 4 spheres. Her immediate and remote memory, attention, language skills and fund of knowledge are appropriate. There is no evidence of aphasia, agnosia, apraxia or anomia. Speech is clear with normal prosody and enunciation. Thought process is linear. Mood is normal and affect is normal.  Cranial nerves II - XII are as described above under HEENT exam.  Motor exam: Normal bulk, strength and tone is noted. There is no tremor, fine motor skills and coordination: grossly intact.  Cerebellar testing: No dysmetria or intention tremor. There is no truncal or gait ataxia.  Sensory exam: intact to light touch in the upper and lower extremities.  Gait, station and balance: She stands easily. No veering to one side is noted. No leaning to one side is noted. Posture is age-appropriate and stance is narrow based. Gait shows slight initial limp on the R, but overall normal stride length and normal pace. No problems turning are noted.   Assessment and Plan:  In summary, Sharyah Bostwick is a very pleasant 49 y.o.-year old female with an underlying medical history of Prediabetes, osteoarthritis of both hips, cervical degenerative disc disease, hyperlipidemia, hypertension, vitamin D deficiency and obesity, whose history and physical exam are concerning for obstructive sleep apnea (OSA). I had a long chat with the patient about my findings and the diagnosis of OSA, its prognosis and treatment options. We talked about medical treatments, surgical interventions and non-pharmacological approaches. I explained in particular the risks and ramifications of untreated moderate to severe OSA, especially with respect to developing cardiovascular disease down  the Road, including congestive heart failure, difficult to treat hypertension, cardiac arrhythmias, or stroke. Even type 2 diabetes has, in part, been linked to untreated OSA. Symptoms of untreated OSA include daytime sleepiness, memory problems, mood irritability and mood disorder such as depression and anxiety, lack of energy, as well as recurrent headaches, especially morning headaches. We talked about maintaining a healthy lifestyle in general, as well as the importance of weight control. We also talked about the importance of good sleep hygiene. I recommended the following at this time: sleep  study.  I explained the sleep test procedure to the patient and also outlined possible surgical and non-surgical treatment options of OSA, including the use of a custom-made dental device (which would require a referral to a specialist dentist or oral surgeon), upper airway surgical options, such as traditional UPPP or a novel less invasive surgical option in the form of Inspire hypoglossal nerve stimulation (which would involve a referral to an ENT surgeon). I also explained the CPAP treatment option to the patient, who indicated that she would be willing to try CPAP if the need arises. I explained the importance of being compliant with PAP treatment, not only for insurance purposes but primarily to improve Her symptoms, and for the patient's long term health benefit, including to reduce Her cardiovascular risks. I answered all her questions today and the patient was in agreement. I plan to see her back after the sleep study is completed and encouraged her to call with any interim questions, concerns, problems or updates.   Thank you very much for allowing me to participate in the care of this nice patient. If I can be of any further assistance to you please do not hesitate to call me at [phone].  Sincerely,   Star Age, MD, PhD

## 2019-07-23 NOTE — Patient Instructions (Signed)

## 2019-07-25 ENCOUNTER — Other Ambulatory Visit: Payer: Self-pay | Admitting: Family Medicine

## 2019-07-25 DIAGNOSIS — E7800 Pure hypercholesterolemia, unspecified: Secondary | ICD-10-CM

## 2019-07-25 MED ORDER — PRAVASTATIN SODIUM 40 MG PO TABS: 40.0000 mg | ORAL_TABLET | Freq: Every day | ORAL | 3 refills | Status: DC

## 2019-07-25 MED ORDER — POTASSIUM CHLORIDE CRYS ER 20 MEQ PO TBCR
20.0000 meq | EXTENDED_RELEASE_TABLET | Freq: Every day | ORAL | 0 refills | Status: DC
Start: 2019-07-25 — End: 2020-05-07

## 2019-07-25 MED ORDER — VITAMIN D (ERGOCALCIFEROL) 1.25 MG (50000 UNIT) PO CAPS
50000.0000 [IU] | ORAL_CAPSULE | ORAL | 1 refills | Status: DC
Start: 2019-07-25 — End: 2020-08-10

## 2019-07-28 ENCOUNTER — Encounter: Payer: Managed Care, Other (non HMO) | Admitting: Family Medicine

## 2019-08-12 ENCOUNTER — Ambulatory Visit: Payer: Managed Care, Other (non HMO) | Admitting: Physical Therapy

## 2019-08-20 ENCOUNTER — Ambulatory Visit (INDEPENDENT_AMBULATORY_CARE_PROVIDER_SITE_OTHER): Payer: Managed Care, Other (non HMO) | Admitting: Neurology

## 2019-08-20 ENCOUNTER — Other Ambulatory Visit: Payer: Self-pay

## 2019-08-20 DIAGNOSIS — G4733 Obstructive sleep apnea (adult) (pediatric): Secondary | ICD-10-CM

## 2019-08-20 DIAGNOSIS — R0683 Snoring: Secondary | ICD-10-CM

## 2019-08-20 DIAGNOSIS — E669 Obesity, unspecified: Secondary | ICD-10-CM

## 2019-08-20 DIAGNOSIS — R351 Nocturia: Secondary | ICD-10-CM

## 2019-08-20 DIAGNOSIS — R519 Headache, unspecified: Secondary | ICD-10-CM

## 2019-08-20 DIAGNOSIS — Z820 Family history of epilepsy and other diseases of the nervous system: Secondary | ICD-10-CM

## 2019-09-08 ENCOUNTER — Telehealth: Payer: Self-pay | Admitting: Neurology

## 2019-09-08 NOTE — Procedures (Signed)
Patient Information     First Name: Veronica Last Name: Pollard ID: [id number]  Birth Date: 1970-09-09 Age: 49 Gender: Female  Referring Provider: Rutherford Guys, MD BMI: 34.4 (W=227 lb, H=5' 8'')  Neck Circ.:  16 '' Epworth:  2/24   Sleep Study Information    Study Date: Aug 20, 2019 S/H/A Version: [id number] / 4.1.1528 / 43  History:    49 year old woman with a history of Prediabetes, osteoarthritis of both hips, cervical degenerative disc disease, hyperlipidemia, hypertension, vitamin D deficiency and obesity, who reports snoring and sleep disruption, particularly, frequent nocturia and morning headaches.  Summary & Diagnosis:     OSA Recommendations:      This home sleep test demonstrates severe obstructive sleep apnea with a total AHI of 52/hour and O2 nadir of 80%. Treatment with positive airway pressure (in the form of CPAP) is recommended. This will require a full night CPAP titration study for proper treatment settings, O2 monitoring and mask fitting. Based on the severity of the sleep disordered breathing an attended titration study is indicated. However, patient's insurance has denied an attended sleep study; therefore, the patient will be advised to proceed with an autoPAP titration/trial at home for now. Please note that untreated obstructive sleep apnea may carry additional perioperative morbidity. Patients with significant obstructive sleep apnea should receive perioperative PAP therapy and the surgeons and particularly the anesthesiologist should be informed of the diagnosis and the severity of the sleep disordered breathing. The patient should be cautioned not to drive, work at heights, or operate dangerous or heavy equipment when tired or sleepy. Review and reiteration of good sleep hygiene measures should be pursued with any patient. Other causes of the patient's symptoms, including circadian rhythm disturbances, an underlying mood disorder, medication effect and/or an underlying  medical problem cannot be ruled out based on this test. Clinical correlation is recommended. The patient and her referring provider will be notified of the test results. The patient will be seen in follow up in sleep clinic at [facility].  I certify that I have reviewed the raw data recording prior to the issuance of this report in accordance with the standards of the American Academy of Sleep Medicine (AASM).  Star Age, MD, PhD [facility]) Diplomat, ABPN (Neurology and Sleep)            Sleep Summary  Oxygen Saturation Statistics   Start Study Time: End Study Time: Total Recording Time:  9:55:55 PM 5:33:38 AM   7 h, 37 min  Total Sleep Time % REM of Sleep Time:  5 h, 32 min  22.5    Mean: 93 Minimum: 80 Maximum: 99  Mean of Desaturations Nadirs (%):   90  Oxygen Desaturation. %:   4-9 10-20 >20 Total  Events Number Total   183  25 88.0 12.0  0 0.0  208 100.0  Oxygen Saturation: <90 <=88 <85 <80 <70  Duration (minutes): Sleep % 10.4 3.1  7.0 1.1  2.1 0.3 0.0 0.0 0.0 0.0     Respiratory Indices      Total Events REM NREM All Night  pRDI:  284  pAHI:  281 ODI:  208  pAHIc:  23  % CSR: 0.0 82.0 81.2 67.4 2.4 43.9 43.4 30.0 4.8 52.6 52.0 38.5 4.3       Pulse Rate Statistics during Sleep (BPM)      Mean:  60 Minimum: 35 Maximum: 93    Indices are calculated using  technically valid sleep time of 5 h, 24 min. pRDI/pAHI are calculated using oxi desaturations ? 3%  Body Position Statistics  Position Supine Prone Right Left Non-Supine  Sleep (min) 128.5 155.0 1.0 46.7 202.7  Sleep % 38.7 46.7 0.3 14.1 61.0  pRDI 46.4 53.1 N/A 67.0 56.5  pAHI 45.9 52.3 N/A 67.0 55.9  ODI 36.8 39.4 N/A 41.9 39.8     Snoring Statistics Snoring Level (dB) >40 >50 >60 >70 >80 >Threshold (45)  Sleep (min) 283.0 207.6 79.6 0.0 0.0 252.5  Sleep % 85.2 62.5 24.0 0.0 0.0 76.0    Mean: 53 dB Sleep Stages Chart                                                                                pAHI=52.0                                                                                            Mild              Moderate                    Severe                                                    5              15                    30

## 2019-09-08 NOTE — Telephone Encounter (Signed)
Pt is asking for the results to her sleep study when available.  Pt states if she is not called by today she will just come to the office on tomorrow.  Pt was told that there is an allowance of 24-48 hours for messages to be responded to.  Pt was told that her intentions of coming to the office on tomorrow if not contacted today would be documented.

## 2019-09-08 NOTE — Telephone Encounter (Signed)
Please review study when able, thanks! Study was completed on 08/20/2019.

## 2019-09-08 NOTE — Progress Notes (Signed)
Patient referred by Dr. Pamella Pert, seen by me on 07/23/19, HST on 08/20/19.  Please call and notify the patient that the recent home sleep test showed obstructive sleep apnea in the severe range. While I recommend treatment for this in the form CPAP, her insurance will not approve a sleep study for this. They will likely only approve a trial of autoPAP, which means, that we don't have to bring her in for a sleep study with CPAP, but will let her start using a so called autoPAP machine at home, through a DME company (of her choice, or as per insurance requirement). The DME representative will educate her on how to use the machine, how to put the mask on, etc. I have placed an order in the chart. Please send referral, talk to patient, send report to referring MD. We will need a FU in sleep clinic for 10 weeks post-PAP set up, please arrange that with me or one of our NPs. Thanks,   Star Age, MD, PhD [facility])

## 2019-09-08 NOTE — Addendum Note (Signed)
Addended by: Star Age on: 09/08/2019 07:43 PM   Modules accepted: Orders

## 2019-09-09 ENCOUNTER — Telehealth: Payer: Self-pay

## 2019-09-09 NOTE — Telephone Encounter (Signed)
I called pt. I advised pt that Dr. Rexene Alberts reviewed their sleep study results and found that pt has severe osa. Dr. Rexene Alberts recommends that pt start an auto pap for treatment. I reviewed PAP compliance expectations with the pt. Pt is agreeable to starting an auto-PAP. I advised pt that an order will be sent to a DME, [facility], and [facility] will call the pt within about one week after they file with the pt's insurance. [facility] will show the pt how to use the machine, fit for masks, and troubleshoot the auto-PAP if needed. A follow up appt was made for insurance purposes with Dr. Rexene Alberts on 11/11/2019 at 830. Pt verbalized understanding to arrive 15 minutes early and bring their auto-PAP. A letter with all of this information in it will be mailed to the pt as a reminder. I verified with the pt that the address we have on file is correct. Pt verbalized understanding of results. Pt had no questions at this time but was encouraged to call back if questions arise. I have sent the order to [facility] and have received confirmation that they have received the order.

## 2019-09-09 NOTE — Telephone Encounter (Signed)
-----   Message from Star Age, MD sent at 09/08/2019  7:43 PM EDT ----- Patient referred by Dr. Pamella Pert, seen by me on 07/23/19, HST on 08/20/19.  Please call and notify the patient that the recent home sleep test showed obstructive sleep apnea in the severe range. While I recommend treatment for this in the form CPAP, her insurance will not approve a sleep study for this. They will likely only approve a trial of autoPAP, which means, that we don't have to bring her in for a sleep study with CPAP, but will let her start using a so called autoPAP machine at home, through a DME company (of her choice, or as per insurance requirement). The DME representative will educate her on how to use the machine, how to put the mask on, etc. I have placed an order in the chart. Please send referral, talk to patient, send report to referring MD. We will need a FU in sleep clinic for 10 weeks post-PAP set up, please arrange that with me or one of our NPs. Thanks,   Star Age, MD, PhD [facility])

## 2019-09-23 ENCOUNTER — Telehealth: Payer: Self-pay

## 2019-09-23 NOTE — Telephone Encounter (Signed)
Pt said you told her to call you on Tuesday. Pt said she would like you to cal her back re: Jeneen Rinks getting paper work together for a grant for a cpap machine.

## 2019-09-24 ENCOUNTER — Telehealth: Payer: Self-pay

## 2019-09-24 NOTE — Telephone Encounter (Signed)
LVM returning patients call.

## 2019-10-07 ENCOUNTER — Ambulatory Visit: Payer: Managed Care, Other (non HMO) | Admitting: Family Medicine

## 2019-10-24 ENCOUNTER — Ambulatory Visit: Payer: Managed Care, Other (non HMO) | Admitting: Family Medicine

## 2019-11-11 ENCOUNTER — Ambulatory Visit: Payer: Managed Care, Other (non HMO) | Admitting: Neurology

## 2019-12-08 ENCOUNTER — Telehealth: Payer: Self-pay

## 2019-12-08 NOTE — Telephone Encounter (Signed)
Received this message from [facility].   This is the pt who claimed she could not afford the machine and was angry that we told her she would need to pay anything as her husband didnt have co-pays. Per your instruction I mailed out the financial waiver form on 09/24/19. Pt never returned this, and now is not answering the phone. Order voided.    Thank you,

## 2020-02-24 ENCOUNTER — Other Ambulatory Visit: Payer: Self-pay | Admitting: Family Medicine

## 2020-02-24 DIAGNOSIS — Z1231 Encounter for screening mammogram for malignant neoplasm of breast: Secondary | ICD-10-CM

## 2020-02-27 ENCOUNTER — Ambulatory Visit: Payer: Managed Care, Other (non HMO)

## 2020-03-17 ENCOUNTER — Ambulatory Visit: Payer: Managed Care, Other (non HMO) | Admitting: Family Medicine

## 2020-03-18 ENCOUNTER — Other Ambulatory Visit: Payer: Self-pay

## 2020-03-18 ENCOUNTER — Ambulatory Visit
Admission: RE | Admit: 2020-03-18 | Discharge: 2020-03-18 | Disposition: A | Discharge status: Home / Self Care | Payer: Managed Care, Other (non HMO) | Source: Ambulatory Visit | Attending: Family Medicine | Admitting: Family Medicine

## 2020-03-18 DIAGNOSIS — Z1231 Encounter for screening mammogram for malignant neoplasm of breast: Secondary | ICD-10-CM

## 2020-04-01 ENCOUNTER — Ambulatory Visit: Payer: Medicare Other | Admitting: Family Medicine

## 2020-04-05 ENCOUNTER — Ambulatory Visit: Payer: Managed Care, Other (non HMO) | Admitting: Family Medicine

## 2020-04-09 ENCOUNTER — Ambulatory Visit: Payer: Managed Care, Other (non HMO)

## 2020-04-23 ENCOUNTER — Ambulatory Visit (INDEPENDENT_AMBULATORY_CARE_PROVIDER_SITE_OTHER): Payer: Managed Care, Other (non HMO) | Admitting: Family Medicine

## 2020-04-23 ENCOUNTER — Other Ambulatory Visit: Payer: Self-pay

## 2020-04-23 ENCOUNTER — Encounter: Payer: Self-pay | Admitting: Family Medicine

## 2020-04-23 VITALS — BP 157/96 | HR 75 | Temp 98.2°F | Ht 68.0 in | Wt 230.0 lb

## 2020-04-23 DIAGNOSIS — Z1211 Encounter for screening for malignant neoplasm of colon: Secondary | ICD-10-CM

## 2020-04-23 DIAGNOSIS — Z23 Encounter for immunization: Secondary | ICD-10-CM

## 2020-04-23 DIAGNOSIS — E7800 Pure hypercholesterolemia, unspecified: Secondary | ICD-10-CM | POA: Diagnosis not present

## 2020-04-23 DIAGNOSIS — Z9114 Patient's other noncompliance with medication regimen: Secondary | ICD-10-CM

## 2020-04-23 DIAGNOSIS — I10 Essential (primary) hypertension: Secondary | ICD-10-CM

## 2020-04-23 DIAGNOSIS — R7303 Prediabetes: Secondary | ICD-10-CM

## 2020-04-23 DIAGNOSIS — K921 Melena: Secondary | ICD-10-CM

## 2020-04-23 DIAGNOSIS — Z6834 Body mass index (BMI) 34.0-34.9, adult: Secondary | ICD-10-CM

## 2020-04-23 DIAGNOSIS — G4733 Obstructive sleep apnea (adult) (pediatric): Secondary | ICD-10-CM

## 2020-04-23 DIAGNOSIS — Z130 Encounter for screening for diseases of the blood and blood-forming organs and certain disorders involving the immune mechanism: Secondary | ICD-10-CM

## 2020-04-23 DIAGNOSIS — N951 Menopausal and female climacteric states: Secondary | ICD-10-CM

## 2020-04-23 MED ORDER — METOPROLOL SUCCINATE ER 25 MG PO TB24
25.0000 mg | ORAL_TABLET | Freq: Every day | ORAL | 3 refills | Status: DC
Start: 2020-04-23 — End: 2020-08-10

## 2020-04-23 MED ORDER — PRAVASTATIN SODIUM 40 MG PO TABS: 40.0000 mg | ORAL_TABLET | Freq: Every day | ORAL | 3 refills | Status: DC

## 2020-04-23 NOTE — Progress Notes (Signed)
Subjective:  Patient ID: Veronica Pollard, female    DOB: [birth year]  Age: 50 y.o. MRN: [id number]  CC:  Chief Complaint  Patient presents with  . Medication Refill    Pt needs refills on Privastatin and metoprolol. Pt has an appt for a physical next month and just wants  refills to get her to that appt.    HPI Veronica Pollard presents for  Medication refill, prior patient of Dr. Pamella Pert - last visit in April 2021.  Physical next month.   Obstructive sleep apnea Evaluated by a sleep specialist, Dr. Rexene Alberts in June of last year, severe OSA based on home sleep test (AHI 38 based on my interpretation of that report) plan for AutoPap.  Plan for 10-week follow-up post Pap set up. Phone message noted from September 2021 regarding equipment being cost prohibitive, did not qualify. Has not discussed potions with sleep specialist.  Tried machine on [employer] - dried out mouth/throat.   Right hip osteoarthritis: Followed by Dr. Erlinda Hong with orthopedics.  Has received steroid injections, also treated for neck pain with radiculopathy, previous steroid tapers. Prior discussion of hip replacement. No pain now with change in exercise regimen in past 6 months.   Prediabetes: Body mass index is 34.97 kg/m. Improved A1c in April last year. 5.8 in 2020 Weight had been up to 400# in past. Lost weight with exercise since 1998.  Would like to lose 20 more pounds.  Exercise classes daily. No fast food. Cooking at home.  Lab Results  Component Value Date   HGBA1C 5.6 07/08/2019   Wt Readings from Last 3 Encounters:  04/23/20 230 lb (104.3 kg)  07/23/19 226 lb (102.5 kg)  07/08/19 226 lb (102.5 kg)   Hypertension: Elevated last visit but had not taken her medication that day.  Elevated reading May 5 as well. Metoprolol 50mg  qd. Not taking every day - only taking if BP running high takes pill if over 90 (only 6-7 per month). Last dose yesterday.  Headaches with other BP meds? Prior multiple meds - able to lower  to one med. Home readings: off meds 110-120 up to 130/86-93.  BP Readings from Last 3 Encounters:  04/23/20 (!) 157/96  07/23/19 (!) 152/104  07/08/19 (!) 160/90   Lab Results  Component Value Date   CREATININE 0.88 07/08/2019   Menopausal hot flashes: Started 3 yrs ago.  Trial of gabapentin last year - helped initial few weeks tried up to 600mg   Stopped gabapentin as min relief after awhile. Still having hot flushes. Has not been on HRT.   Hyperlipidemia: Pravastatin 40 mg daily - only taking 4-6 times per month. No side effects. "bad at taking pills".  Sip of sprite only this am.  Eats ice at times. Hx of low iron in past - 10 yrs ago.  Lab Results  Component Value Date   WBC 7.3 08/06/2018   HGB 11.8 08/06/2018   HCT 36.2 08/06/2018   MCV 84 08/06/2018   PLT 249 08/06/2018    Lab Results  Component Value Date   CHOL 208 (H) 07/08/2019   HDL 49 07/08/2019   LDLCALC 141 (H) 07/08/2019   TRIG 102 07/08/2019   CHOLHDL 4.2 07/08/2019   Lab Results  Component Value Date   ALT 7 07/08/2019   AST 11 07/08/2019   ALKPHOS 71 07/08/2019   BILITOT 0.4 07/08/2019   Health maintenance: Tetanus updated today. Screening options with colonoscopy versus Cologuard discussed. Discussed timing of repeat testing intervals if normal,  as well as potential need for diagnostic Colonoscopy if positive Cologuard. Understanding expressed, and chose Cologuard, but had prior colonoscopy with Dr. Cristina Gong - benign polyps. Trouble with prep in past. ?20-25 yrs ago. Had hemorrhoids and blood in stool in past.  covid vaccine: declines  Flu vaccine: declines  Hep c/hiv screen: declines   History Patient Active Problem List   Diagnosis Date Noted  . Prediabetes 10/03/2018  . BMI 33.0-33.9,adult 10/03/2018  . Bilateral hip pain 08/22/2018  . Neck pain 08/22/2018  . Unilateral primary osteoarthritis, right hip 12/04/2017  . HYPERCHOLESTEROLEMIA 05/25/2010  . FIBROIDS, UTERUS 05/12/2010  .  EXTERNAL HEMORRHOIDS 05/12/2010  . IRRITABLE BOWEL SYNDROME 05/12/2010  . CALLUSES, LEFT FOOT 05/12/2010  . 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 CARPAL TUNNEL SYNDROME, BILATERAL, HX OF 05/12/2010  . OBESITY, NOS 05/17/2006  . ANEMIA, IRON DEFICIENCY, UNSPEC. 05/17/2006  . HYPERTENSION, BENIGN SYSTEMIC 05/17/2006   Past Medical History:  Diagnosis Date  . High cholesterol   . Hypertension   . Osteoarthritis   . Renal disorder    early stage kidney disease   Past Surgical History:  Procedure Laterality Date  . ABDOMINAL HYSTERECTOMY    . BREAST BIOPSY Right 2014   benign  . CARPAL TUNNEL RELEASE    . CHOLECYSTECTOMY    . COLONOSCOPY    . HAMMER TOE SURGERY     Allergies  Allergen Reactions  . Penicillins     REACTION: Swollen throat, difficult breathing  . Penicillins    Prior to Admission medications   Medication Sig Start Date End Date Taking? Authorizing Provider  gabapentin (NEURONTIN) 300 MG capsule Take 1-2 capsules (300-600 mg total) by mouth at bedtime. 07/08/19   Daleen Squibb, MD  ibuprofen (ADVIL) 200 MG tablet Take 200 mg by mouth every 6 (six) hours as needed.    [provider]  metoprolol tartrate (LOPRESSOR) 50 MG tablet TAKE 1 TABLET(50 MG) BY MOUTH DAILY 07/17/19   Jacelyn Pi, Lilia Argue, MD  potassium chloride SA (KLOR-CON) 20 MEQ tablet Take 1 tablet (20 mEq total) by mouth daily. 07/25/19   Daleen Squibb, MD  pravastatin (PRAVACHOL) 40 MG tablet Take 1 tablet (40 mg total) by mouth daily. 07/25/19   Jacelyn Pi, Lilia Argue, MD  Vitamin D, Ergocalciferol, (DRISDOL) 1.25 MG (50000 UNIT) CAPS capsule Take 1 capsule (50,000 Units total) by mouth every 7 (seven) days. 07/25/19   Daleen Squibb, MD   Social History   Socioeconomic History  . Marital status: Married    Spouse name: Not on file  . Number of children: Not on file  . Years of education: Not on file  . Highest education level: Not on file  Occupational  History  . Not on file  Tobacco Use  . Smoking status: Light Tobacco Smoker    Types: Cigarettes  . Smokeless tobacco: Never Used  . Tobacco comment: 1-2 cigarettes a day  Substance and Sexual Activity  . Alcohol use: Yes    Comment: socially  . Drug use: No  . Sexual activity: Not on file  Other Topics Concern  . Not on file  Social History Narrative  . Not on file   Social Determinants of Health   Financial Resource Strain: Not on file  Food Insecurity: Not on file  Transportation Needs: Not on file  Physical Activity: Not on file  Stress: Not on file  Social Connections: Not on file  Intimate Partner Violence: Not  on file    Review of Systems  Constitutional: Negative for fatigue and unexpected weight change.  Respiratory: Negative for chest tightness and shortness of breath.   Cardiovascular: Negative for chest pain, palpitations and leg swelling.  Gastrointestinal: Negative for abdominal pain and blood in stool.  Neurological: Negative for dizziness, syncope, light-headedness and headaches.     Objective:   Vitals:   04/23/20 1040  BP: (!) 157/96  Pulse: 75  Temp: 98.2 F (36.8 C)  TempSrc: Temporal  SpO2: 97%  Weight: 230 lb (104.3 kg)  Height: 5\' 8"  (1.727 m)     Physical Exam Vitals reviewed.  Constitutional:      Appearance: She is well-developed and well-nourished.  HENT:     Head: Normocephalic and atraumatic.  Eyes:     Extraocular Movements: EOM normal.     Conjunctiva/sclera: Conjunctivae normal.     Pupils: Pupils are equal, round, and reactive to light.  Neck:     Vascular: No carotid bruit.  Cardiovascular:     Rate and Rhythm: Normal rate and regular rhythm.     Pulses: Intact distal pulses.     Heart sounds: Normal heart sounds.  Pulmonary:     Effort: Pulmonary effort is normal.     Breath sounds: Normal breath sounds.  Abdominal:     Palpations: Abdomen is soft. There is no pulsatile mass.     Tenderness: There is no  abdominal tenderness.  Skin:    General: Skin is warm and dry.  Neurological:     Mental Status: She is alert and oriented to person, place, and time.  Psychiatric:        Mood and Affect: Mood and affect normal.        Behavior: Behavior normal.      53 minutes spent during visit, greater than 50% counseling and assimilation of information, chart review, and discussion of plan.    Assessment & Plan:  Katty Fretwell is a 50 y.o. female . Essential hypertension, benign - Plan: metoprolol succinate (TOPROL-XL) 25 MG 24 hr tablet, Ambulatory referral to Gastroenterology, Comprehensive metabolic panel  -Uncontrolled with intermittent dosing of short acting metoprolol. Will change to lower dose long-acting once per day. Side effects discussed, home monitoring discussed, RTC precautions  Pure hypercholesterolemia - Plan: pravastatin (PRAVACHOL) 40 MG tablet, Comprehensive metabolic panel, Lipid panel  -Check lipids, but did recommend more consistent use of statin.   Need for vaccination - Plan: Tdap vaccine greater than or equal to 7yo IM  -Plan next visit Special screening for malignant neoplasms, colon - Plan: Ambulatory referral to Gastroenterology  -Initially discussed Cologuard but with history of blood in stool, may not be candidate. Refer to gastroenterology to decide on colonoscopy versus Cologuard, as well as prep options with her history of difficulty with preparation.  Blood in stool - Plan: Ambulatory referral to Gastroenterology, CBC  -Eval with GI as above.  OSA (obstructive sleep apnea)  -Discussed concerns of untreated sleep apnea, especially with degree of OSA. Unfortunately because prohibitive. Recommend she contact sleep specialist again to see if any new problems or options available.  Prediabetes - Plan: Hemoglobin A1c  BMI 34.0-34.9,adult  -Diet modification, activity/exercise discussed, check A1c  Menopausal syndrome (hot flashes)  -Handout given, could  consider hormone treatment but would need to discuss potential risks and benefits. Plan to discuss further next visit.  Screening, anemia, deficiency, iron - Plan: CBC  Nonadherence to medication  -Potential barriers discussed, improved adherence discussed.  Meds ordered  this encounter  Medications  . pravastatin (PRAVACHOL) 40 MG tablet    Sig: Take 1 tablet (40 mg total) by mouth daily.    Dispense:  90 tablet    Refill:  3  . metoprolol succinate (TOPROL-XL) 25 MG 24 hr tablet    Sig: Take 1 tablet (25 mg total) by mouth daily.    Dispense:  90 tablet    Refill:  3   Patient Instructions    Try taking new long acting metoprolol once per day. This should provide more stable control of blood pressure. You may need a higher dose. Return to the clinic or go to the nearest emergency room if any of your symptoms worsen or new symptoms occur.  I am concerned about your untreated sleep apnea. I recommend calling sleep specialist to see if other options or recommendations for getting you started on the CPAP treatment.   I will check cholesterol test today, but I do recommend taking statin every day.   To discuss weight loss, can call [facility]  . [phone]  See info on hormone replacement for hot flushes, and we can discuss further at another visit if that is something you would like to try.   I will refer you to gastroenterology to discuss colonoscopy versus Cologuard as well as blood in stool.  Return to the clinic or go to the nearest emergency room if any of your symptoms worsen or new symptoms occur.   Menopause and Hormone Replacement Therapy Menopause is a normal time of life when menstrual periods stop completely and the ovaries stop producing the female hormones estrogen and progesterone. Low levels of these hormones can affect your health and cause symptoms. Hormone replacement therapy (HRT) can relieve some of those symptoms. HRT is the use of  artificial (synthetic) hormones to replace hormones that your body has stopped producing because you have reached menopause. Types of HRT HRT may consist of the synthetic hormones estrogen and progestin, or it may consist of estrogen-only therapy. You and your health care provider will decide which form of HRT is best for you. If you choose to be on HRT and you have a uterus, estrogen and progestin are usually prescribed. Estrogen-only therapy is used for women who do not have a uterus. Possible options for taking HRT include:  Pills.  Patches.  Gels.  Sprays.  Vaginal cream.  Vaginal rings.  Vaginal inserts. The amount of hormones that you take and how long you take them varies according to your health. It is important to:  Begin HRT with the lowest possible dosage.  Stop HRT as soon as your health care provider tells you to stop.  Work with your health care provider so that you feel informed and comfortable with your decisions.   Tell a health care provider about:  Any allergies you have.  Whether you have had blood clots or know of any risk factors you may have for blood clots.  Whether you or family members have had cancer, especially cancer of the breasts, ovaries, or uterus.  Any surgeries you have had.  All medicines you are taking, including vitamins, herbs, eye drops, creams, and over-the-counter medicines.  Whether you are pregnant or may be pregnant.  Any medical conditions you have. What are the benefits? HRT can reduce the frequency and severity of menopausal symptoms. Benefits of HRT vary according to the kind of symptoms that you have, how severe they are, and your overall health. HRT may help  to improve the following symptoms of menopause:  Hot flashes and night sweats. These are sudden feelings of heat that spread over the face and body. The skin may turn red, like a blush. Night sweats are hot flashes that happen while you are sleeping or trying to  sleep.  Bone loss (osteoporosis). The body loses calcium more quickly after menopause, causing the bones to become weaker. This can increase the risk for bone breaks (fractures).  Vaginal dryness. The lining of the vagina can become thin and dry, which can cause pain during sex or cause infection, burning, or itching.  Urinary tract infections.  Urinary incontinence. This is the inability to control when you urinate.  Irritability.  Short-term memory problems. What are the risks? Risks of HRT vary depending on your individual health and medical history. Risks of HRT also depend on whether you receive both estrogen and progestin or you receive estrogen only. HRT may increase the risk of:  Spotting. This is when a small amount of blood leaks from the vagina unexpectedly.  Endometrial cancer. This cancer is in the lining of the uterus (endometrium).  Breast cancer.  Increased density of breast tissue. This can make it harder to find breast cancer on a breast X-ray (mammogram).  Stroke.  Heart disease.  Blood clots.  Gallbladder disease or liver disease. Risks of HRT can increase if you have any of the following conditions:  Endometrial cancer.  Liver disease.  Heart disease.  Breast cancer.  History of blood clots.  History of stroke. Follow these instructions at home: Pap tests  Have Pap tests done as often as told by your health care provider. A Pap test is sometimes called a Pap smear. It is a screening test that is used to check for signs of cancer of the cervix and vagina. A Pap test can also identify the presence of infection or precancerous changes. Pap tests may be done: ? Every 3 years, starting at age 30. ? Every 5 years, starting after age 37, in combination with testing for human papillomavirus (HPV). ? More often or less often depending on other medical conditions you have, your age, and other risk factors.  It is up to you to get the results of your Pap  test. Ask your health care provider, or the department that is doing the test, when your results will be ready. General instructions  Take over-the-counter and prescription medicines only as told by your health care provider.  Do not use any products that contain nicotine or tobacco. These products include cigarettes, chewing tobacco, and vaping devices, such as e-cigarettes. If you need help quitting, ask your health care provider.  Get mammograms, pelvic exams, and medical checkups as often as told by your health care provider.  Keep all follow-up visits. This is important. Contact a health care provider if you have:  Pain or swelling in your legs.  Lumps or changes in your breasts or armpits.  Pain, burning, or bleeding when you urinate.  Unusual vaginal bleeding.  Dizziness or headaches.  Pain in your abdomen. Get help right away if you have:  Shortness of breath.  Chest pain.  Slurred speech.  Weakness or numbness in any part of your arms or legs. These symptoms may represent a serious problem that is an emergency. Do not wait to see if the symptoms will go away. Get medical help right away. Call your local emergency services (911 in the [place] Do not drive yourself to the hospital. Summary  Menopause is a normal time of life when menstrual periods stop completely and the ovaries stop producing the female hormones estrogen and progesterone.  HRT can reduce the frequency and severity of menopausal symptoms.  Risks of HRT vary depending on your individual health and medical history. This information is not intended to replace advice given to you by your health care provider. Make sure you discuss any questions you have with your health care provider. Document Revised: 09/08/2019 Document Reviewed: 09/08/2019 Elsevier Patient Education  2021 [place] Your Hypertension Hypertension, also called high blood pressure, is when the force of the blood  pressing against the walls of the arteries is too strong. Arteries are blood vessels that carry blood from your heart throughout your body. Hypertension forces the heart to work harder to pump blood and may cause the arteries to become narrow or stiff. Understanding blood pressure readings Your personal target blood pressure may vary depending on your medical conditions, your age, and other factors. A blood pressure reading includes a higher number over a lower number. Ideally, your blood pressure should be below 120/80. You should know that:  The first, or top, number is called the systolic pressure. It is a measure of the pressure in your arteries as your heart beats.  The second, or bottom number, is called the diastolic pressure. It is a measure of the pressure in your arteries as the heart relaxes. Blood pressure is classified into four stages. Based on your blood pressure reading, your health care provider may use the following stages to determine what type of treatment you need, if any. Systolic pressure and diastolic pressure are measured in a unit called mmHg. Normal  Systolic pressure: below 413.  Diastolic pressure: below 80. Elevated  Systolic pressure: 244-010.  Diastolic pressure: below 80. Hypertension stage 1  Systolic pressure: 272-536.  Diastolic pressure: 64-40. Hypertension stage 2  Systolic pressure: 347 or above.  Diastolic pressure: 90 or above. How can this condition affect me? Managing your hypertension is an important responsibility. Over time, hypertension can damage the arteries and decrease blood flow to important parts of the body, including the brain, heart, and kidneys. Having untreated or uncontrolled hypertension can lead to:  A heart attack.  A stroke.  A weakened blood vessel (aneurysm).  Heart failure.  Kidney damage.  Eye damage.  Metabolic syndrome.  Memory and concentration problems.  Vascular dementia. What actions can I take to  manage this condition? Hypertension can be managed by making lifestyle changes and possibly by taking medicines. Your health care provider will help you make a plan to bring your blood pressure within a normal range. Nutrition  Eat a diet that is high in fiber and potassium, and low in salt (sodium), added sugar, and fat. An example eating plan is called the Dietary Approaches to Stop Hypertension (DASH) diet. To eat this way: ? Eat plenty of fresh fruits and vegetables. Try to fill one-half of your plate at each meal with fruits and vegetables. ? Eat whole grains, such as whole-wheat pasta, brown rice, or whole-grain bread. Fill about one-fourth of your plate with whole grains. ? Eat low-fat dairy products. ? Avoid fatty cuts of meat, processed or cured meats, and poultry with skin. Fill about one-fourth of your plate with lean proteins such as fish, chicken without skin, beans, eggs, and tofu. ? Avoid pre-made and processed foods. These tend to be higher in sodium, added sugar, and fat.  Reduce your daily  sodium intake. Most people with hypertension should eat less than 1,500 mg of sodium a day.   Lifestyle  Work with your health care provider to maintain a healthy body weight or to lose weight. Ask what an ideal weight is for you.  Get at least 30 minutes of exercise that causes your heart to beat faster (aerobic exercise) most days of the week. Activities may include walking, swimming, or biking.  Include exercise to strengthen your muscles (resistance exercise), such as weight lifting, as part of your weekly exercise routine. Try to do these types of exercises for 30 minutes at least 3 days a week.  Do not use any products that contain nicotine or tobacco, such as cigarettes, e-cigarettes, and chewing tobacco. If you need help quitting, ask your health care provider.  Control any long-term (chronic) conditions you have, such as high cholesterol or diabetes.  Identify your sources of  stress and find ways to manage stress. This may include meditation, deep breathing, or making time for fun activities.   Alcohol use  Do not drink alcohol if: ? Your health care provider tells you not to drink. ? You are pregnant, may be pregnant, or are planning to become pregnant.  If you drink alcohol: ? Limit how much you use to:  0-1 drink a day for women.  0-2 drinks a day for men. ? Be aware of how much alcohol is in your drink. In the [place] one drink equals one 12 oz bottle of beer (355 mL), one 5 oz glass of wine (148 mL), or one 1 oz glass of hard liquor (44 mL). Medicines Your health care provider may prescribe medicine if lifestyle changes are not enough to get your blood pressure under control and if:  Your systolic blood pressure is 130 or higher.  Your diastolic blood pressure is 80 or higher. Take medicines only as told by your health care provider. Follow the directions carefully. Blood pressure medicines must be taken as told by your health care provider. The medicine does not work as well when you skip doses. Skipping doses also puts you at risk for problems. Monitoring Before you monitor your blood pressure:  Do not smoke, drink caffeinated beverages, or exercise within 30 minutes before taking a measurement.  Use the bathroom and empty your bladder (urinate).  Sit quietly for at least 5 minutes before taking measurements. Monitor your blood pressure at home as told by your health care provider. To do this:  Sit with your back straight and supported.  Place your feet flat on the floor. Do not cross your legs.  Support your arm on a flat surface, such as a table. Make sure your upper arm is at heart level.  Each time you measure, take two or three readings one minute apart and record the results. You may also need to have your blood pressure checked regularly by your health care provider.   General information  Talk with your health care provider about  your diet, exercise habits, and other lifestyle factors that may be contributing to hypertension.  Review all the medicines you take with your health care provider because there may be side effects or interactions.  Keep all visits as told by your health care provider. Your health care provider can help you create and adjust your plan for managing your high blood pressure. Where to find more information  National Heart, Lung, and Blood Institute: https://wilson-eaton.com/  American Heart Association: www.heart.org Contact a health care provider if:  You think you are having a reaction to medicines you have taken.  You have repeated (recurrent) headaches.  You feel dizzy.  You have swelling in your ankles.  You have trouble with your vision. Get help right away if:  You develop a severe headache or confusion.  You have unusual weakness or numbness, or you feel faint.  You have severe pain in your chest or abdomen.  You vomit repeatedly.  You have trouble breathing. These symptoms may represent a serious problem that is an emergency. Do not wait to see if the symptoms will go away. Get medical help right away. Call your local emergency services (911 in the [place] Do not drive yourself to the hospital. Summary  Hypertension is when the force of blood pumping through your arteries is too strong. If this condition is not controlled, it may put you at risk for serious complications.  Your personal target blood pressure may vary depending on your medical conditions, your age, and other factors. For most people, a normal blood pressure is less than 120/80.  Hypertension is managed by lifestyle changes, medicines, or both.  Lifestyle changes to help manage hypertension include losing weight, eating a healthy, low-sodium diet, exercising more, stopping smoking, and limiting alcohol. This information is not intended to replace advice given to you by your health care provider. Make sure you  discuss any questions you have with your health care provider. Document Revised: 04/11/2019 Document Reviewed: 02/04/2019 Elsevier Patient Education  2021 [employer].   If you have lab work done today you will be contacted with your lab results within the next 2 weeks.  If you have not heard from [place] then please contact us. The fastest way to get your results is to register for My Chart.   IF you received an x-ray today, you will receive an invoice from [facility]. Please contact [facility] at [phone] with questions or concerns regarding your invoice.   IF you received labwork today, you will receive an invoice from [facility]. Please contact [facility] at [phone] with questions or concerns regarding your invoice.   Our billing staff will not be able to assist you with questions regarding bills from these companies.  You will be contacted with the lab results as soon as they are available. The fastest way to get your results is to activate your My Chart account. Instructions are located on the last page of this paperwork. If you have not heard from [place] regarding the results in 2 weeks, please contact this office.       Signed, Merri Ray, MD [facility]

## 2020-04-23 NOTE — Patient Instructions (Addendum)
Try taking new long acting metoprolol once per day. This should provide more stable control of blood pressure. You may need a higher dose. Return to the clinic or go to the nearest emergency room if any of your symptoms worsen or new symptoms occur.  I am concerned about your untreated sleep apnea. I recommend calling sleep specialist to see if other options or recommendations for getting you started on the CPAP treatment.   I will check cholesterol test today, but I do recommend taking statin every day.   To discuss weight loss, can call [facility]  . [phone]  See info on hormone replacement for hot flushes, and we can discuss further at another visit if that is something you would like to try.   I will refer you to gastroenterology to discuss colonoscopy versus Cologuard as well as blood in stool.  Return to the clinic or go to the nearest emergency room if any of your symptoms worsen or new symptoms occur.   Menopause and Hormone Replacement Therapy Menopause is a normal time of life when menstrual periods stop completely and the ovaries stop producing the female hormones estrogen and progesterone. Low levels of these hormones can affect your health and cause symptoms. Hormone replacement therapy (HRT) can relieve some of those symptoms. HRT is the use of artificial (synthetic) hormones to replace hormones that your body has stopped producing because you have reached menopause. Types of HRT HRT may consist of the synthetic hormones estrogen and progestin, or it may consist of estrogen-only therapy. You and your health care provider will decide which form of HRT is best for you. If you choose to be on HRT and you have a uterus, estrogen and progestin are usually prescribed. Estrogen-only therapy is used for women who do not have a uterus. Possible options for taking HRT include:  Pills.  Patches.  Gels.  Sprays.  Vaginal cream.  Vaginal rings.  Vaginal  inserts. The amount of hormones that you take and how long you take them varies according to your health. It is important to:  Begin HRT with the lowest possible dosage.  Stop HRT as soon as your health care provider tells you to stop.  Work with your health care provider so that you feel informed and comfortable with your decisions.   Tell a health care provider about:  Any allergies you have.  Whether you have had blood clots or know of any risk factors you may have for blood clots.  Whether you or family members have had cancer, especially cancer of the breasts, ovaries, or uterus.  Any surgeries you have had.  All medicines you are taking, including vitamins, herbs, eye drops, creams, and over-the-counter medicines.  Whether you are pregnant or may be pregnant.  Any medical conditions you have. What are the benefits? HRT can reduce the frequency and severity of menopausal symptoms. Benefits of HRT vary according to the kind of symptoms that you have, how severe they are, and your overall health. HRT may help to improve the following symptoms of menopause:  Hot flashes and night sweats. These are sudden feelings of heat that spread over the face and body. The skin may turn red, like a blush. Night sweats are hot flashes that happen while you are sleeping or trying to sleep.  Bone loss (osteoporosis). The body loses calcium more quickly after menopause, causing the bones to become weaker. This can increase the risk for bone breaks (fractures).  Vaginal dryness. The  lining of the vagina can become thin and dry, which can cause pain during sex or cause infection, burning, or itching.  Urinary tract infections.  Urinary incontinence. This is the inability to control when you urinate.  Irritability.  Short-term memory problems. What are the risks? Risks of HRT vary depending on your individual health and medical history. Risks of HRT also depend on whether you receive both  estrogen and progestin or you receive estrogen only. HRT may increase the risk of:  Spotting. This is when a small amount of blood leaks from the vagina unexpectedly.  Endometrial cancer. This cancer is in the lining of the uterus (endometrium).  Breast cancer.  Increased density of breast tissue. This can make it harder to find breast cancer on a breast X-ray (mammogram).  Stroke.  Heart disease.  Blood clots.  Gallbladder disease or liver disease. Risks of HRT can increase if you have any of the following conditions:  Endometrial cancer.  Liver disease.  Heart disease.  Breast cancer.  History of blood clots.  History of stroke. Follow these instructions at home: Pap tests  Have Pap tests done as often as told by your health care provider. A Pap test is sometimes called a Pap smear. It is a screening test that is used to check for signs of cancer of the cervix and vagina. A Pap test can also identify the presence of infection or precancerous changes. Pap tests may be done: ? Every 3 years, starting at age 52. ? Every 5 years, starting after age 68, in combination with testing for human papillomavirus (HPV). ? More often or less often depending on other medical conditions you have, your age, and other risk factors.  It is up to you to get the results of your Pap test. Ask your health care provider, or the department that is doing the test, when your results will be ready. General instructions  Take over-the-counter and prescription medicines only as told by your health care provider.  Do not use any products that contain nicotine or tobacco. These products include cigarettes, chewing tobacco, and vaping devices, such as e-cigarettes. If you need help quitting, ask your health care provider.  Get mammograms, pelvic exams, and medical checkups as often as told by your health care provider.  Keep all follow-up visits. This is important. Contact a health care provider if  you have:  Pain or swelling in your legs.  Lumps or changes in your breasts or armpits.  Pain, burning, or bleeding when you urinate.  Unusual vaginal bleeding.  Dizziness or headaches.  Pain in your abdomen. Get help right away if you have:  Shortness of breath.  Chest pain.  Slurred speech.  Weakness or numbness in any part of your arms or legs. These symptoms may represent a serious problem that is an emergency. Do not wait to see if the symptoms will go away. Get medical help right away. Call your local emergency services (911 in the [place] Do not drive yourself to the hospital. Summary  Menopause is a normal time of life when menstrual periods stop completely and the ovaries stop producing the female hormones estrogen and progesterone.  HRT can reduce the frequency and severity of menopausal symptoms.  Risks of HRT vary depending on your individual health and medical history. This information is not intended to replace advice given to you by your health care provider. Make sure you discuss any questions you have with your health care provider. Document Revised: 09/08/2019 Document  Reviewed: 09/08/2019 Elsevier Patient Education  2021 [employer].     Managing Your Hypertension Hypertension, also called high blood pressure, is when the force of the blood pressing against the walls of the arteries is too strong. Arteries are blood vessels that carry blood from your heart throughout your body. Hypertension forces the heart to work harder to pump blood and may cause the arteries to become narrow or stiff. Understanding blood pressure readings Your personal target blood pressure may vary depending on your medical conditions, your age, and other factors. A blood pressure reading includes a higher number over a lower number. Ideally, your blood pressure should be below 120/80. You should know that:  The first, or top, number is called the systolic pressure. It is a measure  of the pressure in your arteries as your heart beats.  The second, or bottom number, is called the diastolic pressure. It is a measure of the pressure in your arteries as the heart relaxes. Blood pressure is classified into four stages. Based on your blood pressure reading, your health care provider may use the following stages to determine what type of treatment you need, if any. Systolic pressure and diastolic pressure are measured in a unit called mmHg. Normal  Systolic pressure: below 161.  Diastolic pressure: below 80. Elevated  Systolic pressure: 096-045.  Diastolic pressure: below 80. Hypertension stage 1  Systolic pressure: 409-811.  Diastolic pressure: 91-47. Hypertension stage 2  Systolic pressure: 829 or above.  Diastolic pressure: 90 or above. How can this condition affect me? Managing your hypertension is an important responsibility. Over time, hypertension can damage the arteries and decrease blood flow to important parts of the body, including the brain, heart, and kidneys. Having untreated or uncontrolled hypertension can lead to:  A heart attack.  A stroke.  A weakened blood vessel (aneurysm).  Heart failure.  Kidney damage.  Eye damage.  Metabolic syndrome.  Memory and concentration problems.  Vascular dementia. What actions can I take to manage this condition? Hypertension can be managed by making lifestyle changes and possibly by taking medicines. Your health care provider will help you make a plan to bring your blood pressure within a normal range. Nutrition  Eat a diet that is high in fiber and potassium, and low in salt (sodium), added sugar, and fat. An example eating plan is called the Dietary Approaches to Stop Hypertension (DASH) diet. To eat this way: ? Eat plenty of fresh fruits and vegetables. Try to fill one-half of your plate at each meal with fruits and vegetables. ? Eat whole grains, such as whole-wheat pasta, brown rice, or  whole-grain bread. Fill about one-fourth of your plate with whole grains. ? Eat low-fat dairy products. ? Avoid fatty cuts of meat, processed or cured meats, and poultry with skin. Fill about one-fourth of your plate with lean proteins such as fish, chicken without skin, beans, eggs, and tofu. ? Avoid pre-made and processed foods. These tend to be higher in sodium, added sugar, and fat.  Reduce your daily sodium intake. Most people with hypertension should eat less than 1,500 mg of sodium a day.   Lifestyle  Work with your health care provider to maintain a healthy body weight or to lose weight. Ask what an ideal weight is for you.  Get at least 30 minutes of exercise that causes your heart to beat faster (aerobic exercise) most days of the week. Activities may include walking, swimming, or biking.  Include exercise to strengthen your muscles (  resistance exercise), such as weight lifting, as part of your weekly exercise routine. Try to do these types of exercises for 30 minutes at least 3 days a week.  Do not use any products that contain nicotine or tobacco, such as cigarettes, e-cigarettes, and chewing tobacco. If you need help quitting, ask your health care provider.  Control any long-term (chronic) conditions you have, such as high cholesterol or diabetes.  Identify your sources of stress and find ways to manage stress. This may include meditation, deep breathing, or making time for fun activities.   Alcohol use  Do not drink alcohol if: ? Your health care provider tells you not to drink. ? You are pregnant, may be pregnant, or are planning to become pregnant.  If you drink alcohol: ? Limit how much you use to:  0-1 drink a day for women.  0-2 drinks a day for men. ? Be aware of how much alcohol is in your drink. In the [place] one drink equals one 12 oz bottle of beer (355 mL), one 5 oz glass of wine (148 mL), or one 1 oz glass of hard liquor (44 mL). Medicines Your health care  provider may prescribe medicine if lifestyle changes are not enough to get your blood pressure under control and if:  Your systolic blood pressure is 130 or higher.  Your diastolic blood pressure is 80 or higher. Take medicines only as told by your health care provider. Follow the directions carefully. Blood pressure medicines must be taken as told by your health care provider. The medicine does not work as well when you skip doses. Skipping doses also puts you at risk for problems. Monitoring Before you monitor your blood pressure:  Do not smoke, drink caffeinated beverages, or exercise within 30 minutes before taking a measurement.  Use the bathroom and empty your bladder (urinate).  Sit quietly for at least 5 minutes before taking measurements. Monitor your blood pressure at home as told by your health care provider. To do this:  Sit with your back straight and supported.  Place your feet flat on the floor. Do not cross your legs.  Support your arm on a flat surface, such as a table. Make sure your upper arm is at heart level.  Each time you measure, take two or three readings one minute apart and record the results. You may also need to have your blood pressure checked regularly by your health care provider.   General information  Talk with your health care provider about your diet, exercise habits, and other lifestyle factors that may be contributing to hypertension.  Review all the medicines you take with your health care provider because there may be side effects or interactions.  Keep all visits as told by your health care provider. Your health care provider can help you create and adjust your plan for managing your high blood pressure. Where to find more information  National Heart, Lung, and Blood Institute: https://wilson-eaton.com/  American Heart Association: www.heart.org Contact a health care provider if:  You think you are having a reaction to medicines you have  taken.  You have repeated (recurrent) headaches.  You feel dizzy.  You have swelling in your ankles.  You have trouble with your vision. Get help right away if:  You develop a severe headache or confusion.  You have unusual weakness or numbness, or you feel faint.  You have severe pain in your chest or abdomen.  You vomit repeatedly.  You have trouble breathing.  These symptoms may represent a serious problem that is an emergency. Do not wait to see if the symptoms will go away. Get medical help right away. Call your local emergency services (911 in the [place] Do not drive yourself to the hospital. Summary  Hypertension is when the force of blood pumping through your arteries is too strong. If this condition is not controlled, it may put you at risk for serious complications.  Your personal target blood pressure may vary depending on your medical conditions, your age, and other factors. For most people, a normal blood pressure is less than 120/80.  Hypertension is managed by lifestyle changes, medicines, or both.  Lifestyle changes to help manage hypertension include losing weight, eating a healthy, low-sodium diet, exercising more, stopping smoking, and limiting alcohol. This information is not intended to replace advice given to you by your health care provider. Make sure you discuss any questions you have with your health care provider. Document Revised: 04/11/2019 Document Reviewed: 02/04/2019 Elsevier Patient Education  2021 [employer].   If you have lab work done today you will be contacted with your lab results within the next 2 weeks.  If you have not heard from [place] then please contact us. The fastest way to get your results is to register for My Chart.   IF you received an x-ray today, you will receive an invoice from [facility]. Please contact [facility] at [phone] with questions or concerns regarding your invoice.   IF you received labwork  today, you will receive an invoice from [place]. Please contact [facility] at [phone] with questions or concerns regarding your invoice.   Our billing staff will not be able to assist you with questions regarding bills from these companies.  You will be contacted with the lab results as soon as they are available. The fastest way to get your results is to activate your My Chart account. Instructions are located on the last page of this paperwork. If you have not heard from [place] regarding the results in 2 weeks, please contact this office.

## 2020-04-24 ENCOUNTER — Encounter: Payer: Self-pay | Admitting: Family Medicine

## 2020-04-24 LAB — COMPREHENSIVE METABOLIC PANEL
ALT: 7 IU/L (ref 0–32)
AST: 12 IU/L (ref 0–40)
Albumin/Globulin Ratio: 1.3 (ref 1.2–2.2)
Albumin: 4.3 g/dL (ref 3.8–4.8)
Alkaline Phosphatase: 89 IU/L (ref 44–121)
BUN/Creatinine Ratio: 11 (ref 9–23)
BUN: 10 mg/dL (ref 6–24)
Bilirubin Total: 0.4 mg/dL (ref 0.0–1.2)
CO2: 24 mmol/L (ref 20–29)
Calcium: 9.3 mg/dL (ref 8.7–10.2)
Chloride: 104 mmol/L (ref 96–106)
Creatinine, Ser: 0.89 mg/dL (ref 0.57–1.00)
GFR calc Af Amer: 87 mL/min/{1.73_m2} (ref >59)
GFR calc non Af Amer: 76 mL/min/{1.73_m2} (ref >59)
Globulin, Total: 3.3 g/dL (ref 1.5–4.5)
Glucose: 90 mg/dL (ref 65–99)
Potassium: 4.1 mmol/L (ref 3.5–5.2)
Sodium: 142 mmol/L (ref 134–144)
Total Protein: 7.6 g/dL (ref 6.0–8.5)

## 2020-04-24 LAB — HEMOGLOBIN A1C
Est. average glucose Bld gHb Est-mCnc: 120 mg/dL
Hgb A1c MFr Bld: 5.8 % — ABNORMAL HIGH (ref 4.8–5.6)

## 2020-04-24 LAB — CBC
Hematocrit: 35.1 % (ref 34.0–46.6)
Hemoglobin: 10.8 g/dL — ABNORMAL LOW (ref 11.1–15.9)
MCH: 22.7 pg — ABNORMAL LOW (ref 26.6–33.0)
MCHC: 30.8 g/dL — ABNORMAL LOW (ref 31.5–35.7)
MCV: 74 fL — ABNORMAL LOW (ref 79–97)
Platelets: 273 10*3/uL (ref 150–450)
RBC: 4.76 x10E6/uL (ref 3.77–5.28)
RDW: 17.1 % — ABNORMAL HIGH (ref 11.7–15.4)
WBC: 7.3 10*3/uL (ref 3.4–10.8)

## 2020-04-24 LAB — LIPID PANEL
Chol/HDL Ratio: 5.1 ratio — ABNORMAL HIGH (ref 0.0–4.4)
Cholesterol, Total: 224 mg/dL — ABNORMAL HIGH (ref 100–199)
HDL: 44 mg/dL (ref >39)
LDL Chol Calc (NIH): 157 mg/dL — ABNORMAL HIGH (ref 0–99)
Triglycerides: 126 mg/dL (ref 0–149)
VLDL Cholesterol Cal: 23 mg/dL (ref 5–40)

## 2020-05-07 ENCOUNTER — Encounter (HOSPITAL_COMMUNITY): Payer: Self-pay | Admitting: Emergency Medicine

## 2020-05-07 ENCOUNTER — Emergency Department (HOSPITAL_COMMUNITY)
Admission: EM | Admit: 2020-05-07 | Discharge: 2020-05-07 | Disposition: A | Discharge status: Home / Self Care | Payer: Managed Care, Other (non HMO) | Attending: Emergency Medicine | Admitting: Emergency Medicine

## 2020-05-07 DIAGNOSIS — R109 Unspecified abdominal pain: Secondary | ICD-10-CM | POA: Insufficient documentation

## 2020-05-07 DIAGNOSIS — R197 Diarrhea, unspecified: Secondary | ICD-10-CM | POA: Diagnosis not present

## 2020-05-07 DIAGNOSIS — Z79899 Other long term (current) drug therapy: Secondary | ICD-10-CM | POA: Insufficient documentation

## 2020-05-07 DIAGNOSIS — N186 End stage renal disease: Secondary | ICD-10-CM | POA: Diagnosis not present

## 2020-05-07 DIAGNOSIS — R1110 Vomiting, unspecified: Secondary | ICD-10-CM | POA: Insufficient documentation

## 2020-05-07 DIAGNOSIS — E876 Hypokalemia: Secondary | ICD-10-CM

## 2020-05-07 DIAGNOSIS — F17210 Nicotine dependence, cigarettes, uncomplicated: Secondary | ICD-10-CM | POA: Diagnosis not present

## 2020-05-07 DIAGNOSIS — I120 Hypertensive chronic kidney disease with stage 5 chronic kidney disease or end stage renal disease: Secondary | ICD-10-CM | POA: Diagnosis not present

## 2020-05-07 DIAGNOSIS — R112 Nausea with vomiting, unspecified: Secondary | ICD-10-CM

## 2020-05-07 DIAGNOSIS — R1084 Generalized abdominal pain: Secondary | ICD-10-CM

## 2020-05-07 LAB — CBC
HCT: 33.8 % — ABNORMAL LOW (ref 36.0–46.0)
Hemoglobin: 10.7 g/dL — ABNORMAL LOW (ref 12.0–15.0)
MCH: 23 pg — ABNORMAL LOW (ref 26.0–34.0)
MCHC: 31.7 g/dL (ref 30.0–36.0)
MCV: 72.7 fL — ABNORMAL LOW (ref 80.0–100.0)
Platelets: 269 10*3/uL (ref 150–400)
RBC: 4.65 MIL/uL (ref 3.87–5.11)
RDW: 18.3 % — ABNORMAL HIGH (ref 11.5–15.5)
WBC: 8.8 10*3/uL (ref 4.0–10.5)
nRBC: 0 % (ref 0.0–0.2)

## 2020-05-07 LAB — URINALYSIS, ROUTINE W REFLEX MICROSCOPIC
Bilirubin Urine: NEGATIVE
Glucose, UA: NEGATIVE mg/dL
Ketones, ur: NEGATIVE mg/dL
Leukocytes,Ua: NEGATIVE
Nitrite: NEGATIVE
Protein, ur: 100 mg/dL — AB
Specific Gravity, Urine: 1.024 (ref 1.005–1.030)
pH: 7 (ref 5.0–8.0)

## 2020-05-07 LAB — COMPREHENSIVE METABOLIC PANEL
ALT: 11 U/L (ref 0–44)
AST: 17 U/L (ref 15–41)
Albumin: 3.8 g/dL (ref 3.5–5.0)
Alkaline Phosphatase: 70 U/L (ref 38–126)
Anion gap: 12 (ref 5–15)
BUN: 10 mg/dL (ref 6–20)
CO2: 22 mmol/L (ref 22–32)
Calcium: 9.2 mg/dL (ref 8.9–10.3)
Chloride: 100 mmol/L (ref 98–111)
Creatinine, Ser: 0.85 mg/dL (ref 0.44–1.00)
GFR, Estimated: >60 mL/min (ref >60)
Glucose, Bld: 122 mg/dL — ABNORMAL HIGH (ref 70–99)
Potassium: 2.8 mmol/L — ABNORMAL LOW (ref 3.5–5.1)
Sodium: 134 mmol/L — ABNORMAL LOW (ref 135–145)
Total Bilirubin: 0.7 mg/dL (ref 0.3–1.2)
Total Protein: 7.7 g/dL (ref 6.5–8.1)

## 2020-05-07 LAB — I-STAT BETA HCG BLOOD, ED (MC, WL, AP ONLY): I-stat hCG, quantitative: <5 m[IU]/mL (ref <5)

## 2020-05-07 LAB — LIPASE, BLOOD: Lipase: 22 U/L (ref 11–51)

## 2020-05-07 MED ORDER — DICYCLOMINE HCL 20 MG PO TABS
20.0000 mg | ORAL_TABLET | Freq: Two times a day (BID) | ORAL | 0 refills | Status: DC
Start: 2020-05-07 — End: 2020-08-10

## 2020-05-07 MED ORDER — POTASSIUM CHLORIDE CRYS ER 20 MEQ PO TBCR
40.0000 meq | EXTENDED_RELEASE_TABLET | Freq: Once | ORAL | Status: AC
  Administered 2020-05-07: 40 meq via ORAL
  Filled 2020-05-07: qty 2

## 2020-05-07 MED ORDER — FENTANYL CITRATE (PF) 100 MCG/2ML IJ SOLN
100.0000 ug | Freq: Once | INTRAMUSCULAR | Status: AC
  Administered 2020-05-07: 100 ug via INTRAVENOUS
  Filled 2020-05-07: qty 2

## 2020-05-07 MED ORDER — ONDANSETRON 4 MG PO TBDP: 4.0000 mg | ORAL_TABLET | Freq: Three times a day (TID) | ORAL | 0 refills | Status: DC | PRN

## 2020-05-07 MED ORDER — ONDANSETRON HCL 4 MG/2ML IJ SOLN
4.0000 mg | Freq: Once | INTRAMUSCULAR | Status: AC
  Administered 2020-05-07: 4 mg via INTRAVENOUS
  Filled 2020-05-07: qty 2

## 2020-05-07 MED ORDER — POTASSIUM CHLORIDE CRYS ER 20 MEQ PO TBCR: 20.0000 meq | EXTENDED_RELEASE_TABLET | Freq: Two times a day (BID) | ORAL | 0 refills | Status: DC

## 2020-05-07 MED ORDER — LACTATED RINGERS IV BOLUS
1000.0000 mL | Freq: Once | INTRAVENOUS | Status: AC
  Administered 2020-05-07: 1000 mL via INTRAVENOUS

## 2020-05-07 NOTE — ED Provider Notes (Signed)
  Physical Exam  BP (!) 189/85   Pulse (!) 59   Temp 98.2 F (36.8 C) (Oral)   Resp 14   Ht 5\' 9"  (1.753 m)   Wt 104.3 kg   SpO2 100%   BMI 33.97 kg/m   Physical Exam  ED Course/Procedures     Procedures  MDM  Received care of patient at [facility] from Dr. Christy Gentles.  Briefly, this is a very pleasant 50 year old female who presented with abdominal pain, nausea, vomiting and diarrhea.  Received fluids, nausea, pain medications with improvement.   Labs without signs of pancreatitis, hepatitis, UTI. K 2.8, given oral replacement, rx for same, and able to tolerate po.  Abdominal exam benign at time of my exam, no tenderness.  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 Recommend PCP follow up, continued supportive care.       Gareth Morgan, MD 05/07/20 2255

## 2020-05-07 NOTE — ED Triage Notes (Signed)
Pt reports vomiting atleast 10 times last night. Also reports severe stomach pains. Pt yelling in triage her stomach is hurting.

## 2020-05-07 NOTE — ED Provider Notes (Signed)
[facility] EMERGENCY DEPARTMENT Provider Note   CSN: [id number] Arrival date & time: 05/07/20  0546     History Chief Complaint  Patient presents with  . Emesis  . Abdominal Pain    Veronica Pollard is a 50 y.o. female.  The history is provided by the patient.  Emesis Severity:  Severe Duration:  9 hours Number of daily episodes:  10 Progression:  Worsening Chronicity:  New Relieved by:  Nothing Associated symptoms: abdominal pain and diarrhea   Associated symptoms: no fever   Abdominal Pain Associated symptoms: diarrhea and vomiting   Associated symptoms: no chest pain and no fever   Patient with history of hypertension hyperlipidemia presents with abrupt onset of abdominal pain.  She reports up to 10 episodes of vomiting over the past 9 hours She thinks this may be her irritable bowel syndrome flaring up.  She had otherwise been well and at her baseline yesterday.    Past Medical History:  Diagnosis Date  . High cholesterol   . Hypertension   . Osteoarthritis   . Renal disorder    early stage kidney disease    Patient Active Problem List   Diagnosis Date Noted  . Prediabetes 10/03/2018  . BMI 33.0-33.9,adult 10/03/2018  . Bilateral hip pain 08/22/2018  . Neck pain 08/22/2018  . Unilateral primary osteoarthritis, right hip 12/04/2017  . HYPERCHOLESTEROLEMIA 05/25/2010  . FIBROIDS, UTERUS 05/12/2010  . EXTERNAL HEMORRHOIDS 05/12/2010  . IRRITABLE BOWEL SYNDROME 05/12/2010  . CALLUSES, LEFT FOOT 05/12/2010  . 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 CARPAL TUNNEL SYNDROME, BILATERAL, HX OF 05/12/2010  . OBESITY, NOS 05/17/2006  . ANEMIA, IRON DEFICIENCY, UNSPEC. 05/17/2006  . HYPERTENSION, BENIGN SYSTEMIC 05/17/2006    Past Surgical History:  Procedure Laterality Date  . ABDOMINAL HYSTERECTOMY    . BREAST BIOPSY Right 2014   benign  . CARPAL TUNNEL RELEASE    . CHOLECYSTECTOMY    . COLONOSCOPY    . HAMMER TOE SURGERY        OB History   No obstetric history on file.     Family History  Problem Relation Age of Onset  . Breast cancer Mother   . Breast cancer Maternal Aunt     Social History   Tobacco Use  . Smoking status: Light Tobacco Smoker    Types: Cigarettes  . Smokeless tobacco: Never Used  . Tobacco comment: 1-2 cigarettes a day  Substance Use Topics  . Alcohol use: Yes    Comment: socially  . Drug use: No    Home Medications Prior to Admission medications   Medication Sig Start Date End Date Taking? Authorizing Provider  gabapentin (NEURONTIN) 300 MG capsule Take 1-2 capsules (300-600 mg total) by mouth at bedtime. Patient not taking: Reported on 04/23/2020 07/08/19   Jacelyn Pi, Lilia Argue, MD  ibuprofen (ADVIL) 200 MG tablet Take 200 mg by mouth every 6 (six) hours as needed. Patient not taking: Reported on 04/23/2020    [provider]  metoprolol succinate (TOPROL-XL) 25 MG 24 hr tablet Take 1 tablet (25 mg total) by mouth daily. 04/23/20   Wendie Agreste, MD  metoprolol tartrate (LOPRESSOR) 50 MG tablet TAKE 1 TABLET(50 MG) BY MOUTH DAILY 07/17/19   Jacelyn Pi, Lilia Argue, MD  potassium chloride SA (KLOR-CON) 20 MEQ tablet Take 1 tablet (20 mEq total) by mouth daily. Patient not taking: Reported on 04/23/2020 07/25/19   Jacelyn Pi, Lilia Argue, MD  pravastatin (PRAVACHOL) 40 MG  tablet Take 1 tablet (40 mg total) by mouth daily. 04/23/20   Wendie Agreste, MD  Vitamin D, Ergocalciferol, (DRISDOL) 1.25 MG (50000 UNIT) CAPS capsule Take 1 capsule (50,000 Units total) by mouth every 7 (seven) days. Patient not taking: Reported on 04/23/2020 07/25/19   Jacelyn Pi, Lilia Argue, MD    Allergies    Penicillins and Penicillins  Review of Systems   Review of Systems  Constitutional: Negative for fever.  Cardiovascular: Negative for chest pain.  Gastrointestinal: Positive for abdominal pain, diarrhea and vomiting.  All other systems reviewed and are negative.   Physical Exam Updated  Vital Signs BP (!) 151/111 (BP Location: Right Arm)   Pulse 60   Temp 98.2 F (36.8 C) (Oral)   Resp 16   Ht 1.753 m (5\' 9" )   Wt 104.3 kg   SpO2 100%   BMI 33.97 kg/m   Physical Exam CONSTITUTIONAL: Well developed/well nourished, anxious and uncomfortable. HEAD: Normocephalic/atraumatic EYES: EOMI/PERRL, no icterus ENMT: Mask in place NECK: supple no meningeal signs SPINE/BACK:entire spine nontender CV: S1/S2 noted, no murmurs/rubs/gallops noted LUNGS: Lungs are clear to auscultation bilaterally, no apparent distress ABDOMEN: soft, diffuse moderate lower abdominal tenderness, no rebound or guarding, bowel sounds noted throughout abdomen GU:no cva tenderness NEURO: Pt is awake/alert/appropriate, moves all extremitiesx4.  No facial droop.   EXTREMITIES: pulses normal/equal, full ROM SKIN: warm, color normal PSYCH: Anxious  ED Results / Procedures / Treatments   Labs (all labs ordered are listed, but only abnormal results are displayed) Labs Reviewed  CBC - Abnormal; Notable for the following components:      Result Value   Hemoglobin 10.7 (*)    HCT 33.8 (*)    MCV 72.7 (*)    MCH 23.0 (*)    RDW 18.3 (*)    All other components within normal limits  LIPASE, BLOOD  COMPREHENSIVE METABOLIC PANEL  URINALYSIS, ROUTINE W REFLEX MICROSCOPIC  I-STAT BETA HCG BLOOD, ED ([facility], [facility], [facility] ONLY)    EKG None  Radiology No results found.  Procedures Procedures   Medications Ordered in ED Medications  fentaNYL (SUBLIMAZE) injection 100 mcg (has no administration in time range)  ondansetron (ZOFRAN) injection 4 mg (has no administration in time range)    ED Course  I have reviewed the triage vital signs and the nursing notes.  Pertinent labs results that were available during my care of the patient were reviewed by me and considered in my medical decision making (see chart for details).    MDM Rules/Calculators/A&P                          7:10 AM Pt presented with  abdominal pain and vomiting.  Patient was very uncomfortable on initial exam.  After she had a bowel movement and received pain medicines, she is now feeling improved. Signed out to Dr Billy Fischer with labs/ekg pending If continues to improve she will be appropriate for d/c home  Final Clinical Impression(s) / ED Diagnoses Final diagnoses:  None    Rx / DC Orders ED Discharge Orders    None       Ripley Fraise, MD 05/07/20 [phone]

## 2020-05-08 LAB — URINE CULTURE

## 2020-05-19 ENCOUNTER — Encounter: Payer: Managed Care, Other (non HMO) | Admitting: Family Medicine

## 2020-08-10 ENCOUNTER — Ambulatory Visit: Payer: Managed Care, Other (non HMO) | Admitting: Family Medicine

## 2020-08-10 ENCOUNTER — Other Ambulatory Visit: Payer: Self-pay

## 2020-08-10 VITALS — BP 178/104 | HR 58 | Ht 69.0 in | Wt 233.2 lb

## 2020-08-10 DIAGNOSIS — Z720 Tobacco use: Secondary | ICD-10-CM

## 2020-08-10 DIAGNOSIS — E7800 Pure hypercholesterolemia, unspecified: Secondary | ICD-10-CM

## 2020-08-10 DIAGNOSIS — K589 Irritable bowel syndrome without diarrhea: Secondary | ICD-10-CM

## 2020-08-10 DIAGNOSIS — I10 Essential (primary) hypertension: Secondary | ICD-10-CM | POA: Diagnosis not present

## 2020-08-10 DIAGNOSIS — E876 Hypokalemia: Secondary | ICD-10-CM

## 2020-08-10 DIAGNOSIS — F439 Reaction to severe stress, unspecified: Secondary | ICD-10-CM | POA: Diagnosis not present

## 2020-08-10 DIAGNOSIS — Z1211 Encounter for screening for malignant neoplasm of colon: Secondary | ICD-10-CM

## 2020-08-10 MED ORDER — METOPROLOL SUCCINATE ER 50 MG PO TB24: 50.0000 mg | ORAL_TABLET | Freq: Every day | ORAL | 2 refills | Status: DC

## 2020-08-10 NOTE — Progress Notes (Signed)
Subjective:    Patient ID: Veronica Pollard, female    DOB: November 27, 1970, 50 y.o.   MRN: [id number]   CC: Establish care, New Patient  HPI:  Veronica Pollard is a very pleasant 50 y.o. female who presents today to establish care.  Initial concerns:  Hypertension: - Medications: Metoprolol XL 25 mg, recently decreased from 50 mg - Compliance: Yes - Checking BP at home: No - Denies any SOB, CP, vision changes, LE edema - Diet: Regular - Exercise: Cardio, strength training, cross fit (3-5x/wk)  Stress Husband diagnosed with PTSD and stated he was evaluated by a therapist/psychiatrist and labeled a sociopath. They are no long intimate and sleep seperately. Consider sex therapy. She has an adult son who still lives in the home and their is tension with responsibility and rules in the home. Plays guitar or works out to relieve home stress.   Tobacco use disorder Down to 3 cigarettes/wk. Hx of cessation for a duration of 11 months. Will sometimes light a cigarette and put it out. Smokes due to home stress. Open to gradually decreasing amount until complete cessation.   Past medical history: Past Medical History:  Diagnosis Date  . High cholesterol   . Hypertension   . Osteoarthritis   . Renal disorder    early stage kidney disease   Past surgical history: Past Surgical History:  Procedure Laterality Date  . ABDOMINAL HYSTERECTOMY    . BREAST BIOPSY Right 2014   benign  . CARPAL TUNNEL RELEASE    . CHOLECYSTECTOMY    . COLONOSCOPY    . HAMMER TOE SURGERY     Current medications:   Family history: Family History  Problem Relation Age of Onset  . Breast cancer Mother   . Breast cancer Maternal Aunt    Social history: Social History   Socioeconomic History  . Marital status: Married    Spouse name: Not on file  . Number of children: Not on file  . Years of education: Not on file  . Highest education level: Not on file  Occupational History  . Not on file  Tobacco Use   . Smoking status: Light Tobacco Smoker    Types: Cigarettes  . Smokeless tobacco: Never Used  . Tobacco comment: 1-2 cigarettes a day  Substance and Sexual Activity  . Alcohol use: Yes    Comment: socially  . Drug use: No  . Sexual activity: Not on file  Other Topics Concern  . Not on file  Social History Narrative  . Not on file   Social Determinants of Health   Financial Resource Strain: Not on file  Food Insecurity: Not on file  Transportation Needs: Not on file  Physical Activity: Not on file  Stress: Not on file  Social Connections: Not on file     ROS: pertinent noted in the HPI   Objective:  BP (!) 178/104   Pulse (!) 58   Ht 5\' 9"  (1.753 m)   Wt 233 lb 3.2 oz (105.8 kg)   SpO2 99%   BMI 34.44 kg/m   Vitals and nursing note reviewed  General: NAD, pleasant, able to participate in exam Cardiac: RRR, S1 S2 present. normal heart sounds, no murmurs. Respiratory: CTAB, normal effort, No wheezes, rales or rhonchi Abdomen: Bowel sounds present, non-tender, non-distended, no hepatosplenomegaly Extremities: no edema or cyanosis. Skin: warm and dry, no rashes noted Neuro: alert, no obvious focal deficits Psych: Normal affect and mood   Assessment & Plan:   1.  HYPERTENSION, BENIGN SYSTEMIC BP not well controlled today in office. Pt asymptomatic. Precautions given - metoprolol succinate (TOPROL XL) 50 MG 24 hr tablet; Take 1 tablet (50 mg total) by mouth daily. Take with or immediately following a meal.  Dispense: 30 tablet; Refill: 2 - F/u 1 week for BP recheck.  -Consider other anti-hypertensive agents  2. Stress at home Multiple stressors. Could benefit from personal or family therapy. She is not in immediate danger and states she is safe in the home.  -[email or web address] to seek choice of counseler -Continue stress relieving activities (working out, Proofreader)  3. Tobacco abuse disorder Currently 3 cigarettes per week. Will decrease by 1 cigarette  each week until complete cessation. Hx of cessation for a duration of 11 months. Triggers are home stressors. Consider other outlets of stress relievers.   4. Colon cancer screening - Ambulatory referral to Gastroenterology  6. Hypokalemia K 2.8 on 05/07/2020 by previous PCP. Hx of potassium supplementation. No follow up labs. - Basic metabolic panel -Consider need for further supplementation  7. HYPERCHOLESTEROLEMIA Continue pravastatin 40 mg daily - LDL Cholesterol, Direct - Consider dose increase if >100   Gerlene Fee, DO [facility] PGY-2

## 2020-08-10 NOTE — Patient Instructions (Addendum)
It was wonderful to see you today.  Please bring ALL of your medications with you to every visit.   Today we talked about:  Following up in 1 week to recheck blood pressure stop taking the 25 mg pill. Start metoprolol 50 mg daily this was sent to your pharmacy.   Decreasing cigarettes per week from 3 to 2 keeping of quitting a month.  Scheduling colonoscopy.  They will give you a call.  Seeking family therapy.  Go to [email or web address]  We will follow-up next week.  Please be sure to schedule follow up at the front  desk before you leave today.   If you haven't already, sign up for My Chart to have easy access to your labs results, and communication with your primary care physician.  Please call the clinic at [phone] if your symptoms worsen or you have any concerns. It was our pleasure to serve you.  Dr. Janus Molder [email or web address]

## 2020-08-11 ENCOUNTER — Encounter: Payer: Self-pay | Admitting: Family Medicine

## 2020-08-11 DIAGNOSIS — G4730 Sleep apnea, unspecified: Secondary | ICD-10-CM | POA: Insufficient documentation

## 2020-08-11 DIAGNOSIS — M1990 Unspecified osteoarthritis, unspecified site: Secondary | ICD-10-CM | POA: Insufficient documentation

## 2020-08-11 LAB — BASIC METABOLIC PANEL
BUN/Creatinine Ratio: 13 (ref 9–23)
BUN: 12 mg/dL (ref 6–24)
CO2: 22 mmol/L (ref 20–29)
Calcium: 9.1 mg/dL (ref 8.7–10.2)
Chloride: 103 mmol/L (ref 96–106)
Creatinine, Ser: 0.95 mg/dL (ref 0.57–1.00)
Glucose: 91 mg/dL (ref 65–99)
Potassium: 4.1 mmol/L (ref 3.5–5.2)
Sodium: 138 mmol/L (ref 134–144)
eGFR: 73 mL/min/{1.73_m2} (ref >59)

## 2020-08-11 LAB — LDL CHOLESTEROL, DIRECT: LDL Direct: 133 mg/dL — ABNORMAL HIGH (ref 0–99)

## 2020-08-13 ENCOUNTER — Telehealth: Payer: Self-pay | Admitting: Family Medicine

## 2020-08-13 NOTE — Telephone Encounter (Signed)
Called patient to discuss elevated LDL. LVM for her to call back to discuss. Consider increasing pravastatin at next visit.   Gerlene Fee, DO 08/13/2020, 12:01 PM PGY-2, [place]

## 2020-08-23 ENCOUNTER — Ambulatory Visit (HOSPITAL_COMMUNITY)
Admission: EM | Admit: 2020-08-23 | Discharge: 2020-08-23 | Disposition: A | Discharge status: Home / Self Care | Payer: Managed Care, Other (non HMO) | Attending: Internal Medicine | Admitting: Internal Medicine

## 2020-08-23 ENCOUNTER — Encounter (HOSPITAL_COMMUNITY): Payer: Self-pay

## 2020-08-23 DIAGNOSIS — T300 Burn of unspecified body region, unspecified degree: Secondary | ICD-10-CM

## 2020-08-23 MED ORDER — LIDOCAINE-PRILOCAINE 2.5-2.5 % EX CREA: 1.0000 "application " | TOPICAL_CREAM | CUTANEOUS | 0 refills | Status: DC | PRN

## 2020-08-23 MED ORDER — SILVER SULFADIAZINE 1 % EX CREA: 1.0000 "application " | TOPICAL_CREAM | Freq: Every day | CUTANEOUS | 0 refills | Status: DC
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
## 2020-08-24 ENCOUNTER — Other Ambulatory Visit: Payer: Self-pay

## 2020-08-24 ENCOUNTER — Emergency Department (HOSPITAL_COMMUNITY)
Admission: EM | Admit: 2020-08-24 | Discharge: 2020-08-24 | Discharge status: Against Medical Advice | Payer: Managed Care, Other (non HMO) | Attending: Emergency Medicine | Admitting: Emergency Medicine

## 2020-08-24 DIAGNOSIS — Z5321 Procedure and treatment not carried out due to patient leaving prior to being seen by health care provider: Secondary | ICD-10-CM | POA: Insufficient documentation

## 2020-08-24 DIAGNOSIS — R109 Unspecified abdominal pain: Secondary | ICD-10-CM | POA: Insufficient documentation

## 2020-08-24 DIAGNOSIS — R112 Nausea with vomiting, unspecified: Secondary | ICD-10-CM | POA: Diagnosis present

## 2020-08-24 LAB — CBC
HCT: 34.8 % — ABNORMAL LOW (ref 36.0–46.0)
Hemoglobin: 10.9 g/dL — ABNORMAL LOW (ref 12.0–15.0)
MCH: 24 pg — ABNORMAL LOW (ref 26.0–34.0)
MCHC: 31.3 g/dL (ref 30.0–36.0)
MCV: 76.7 fL — ABNORMAL LOW (ref 80.0–100.0)
Platelets: 275 10*3/uL (ref 150–400)
RBC: 4.54 MIL/uL (ref 3.87–5.11)
RDW: 16.4 % — ABNORMAL HIGH (ref 11.5–15.5)
WBC: 7.9 10*3/uL (ref 4.0–10.5)
nRBC: 0 % (ref 0.0–0.2)

## 2020-08-24 LAB — COMPREHENSIVE METABOLIC PANEL
ALT: 14 U/L (ref 0–44)
AST: 19 U/L (ref 15–41)
Albumin: 3.8 g/dL (ref 3.5–5.0)
Alkaline Phosphatase: 74 U/L (ref 38–126)
Anion gap: 12 (ref 5–15)
BUN: 10 mg/dL (ref 6–20)
CO2: 23 mmol/L (ref 22–32)
Calcium: 9.2 mg/dL (ref 8.9–10.3)
Chloride: 101 mmol/L (ref 98–111)
Creatinine, Ser: 0.87 mg/dL (ref 0.44–1.00)
GFR, Estimated: >60 mL/min (ref >60)
Glucose, Bld: 143 mg/dL — ABNORMAL HIGH (ref 70–99)
Potassium: 3.3 mmol/L — ABNORMAL LOW (ref 3.5–5.1)
Sodium: 136 mmol/L (ref 135–145)
Total Bilirubin: 0.8 mg/dL (ref 0.3–1.2)
Total Protein: 7.7 g/dL (ref 6.5–8.1)

## 2020-08-24 LAB — LIPASE, BLOOD: Lipase: 21 U/L (ref 11–51)

## 2020-08-24 MED ORDER — ONDANSETRON 4 MG PO TBDP
8.0000 mg | ORAL_TABLET | Freq: Once | ORAL | Status: AC
  Administered 2020-08-24: 8 mg via ORAL
  Filled 2020-08-24: qty 2

## 2020-08-24 MED ORDER — FENTANYL CITRATE (PF) 100 MCG/2ML IJ SOLN
50.0000 ug | Freq: Once | INTRAMUSCULAR | Status: AC
  Administered 2020-08-24: 50 ug via INTRAVENOUS
  Filled 2020-08-24: qty 2
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
## 2020-08-24 NOTE — ED Notes (Signed)
Pt stated she was feeling better so she was going to leave and go home. Moving pt OTF.

## 2020-08-24 NOTE — ED Triage Notes (Signed)
Pt with hx of IBS reports abdominal pain, n/v since 0030 this morning.

## 2020-08-24 NOTE — ED Provider Notes (Addendum)
Emergency Medicine Provider Triage Evaluation Note  Veronica Pollard , a 50 y.o. female  was evaluated in triage.  Pt complains of abdominal pain since midnight. Nausea and 3 episodes of vomiting. H/O hysterectomy and cholecystectomy. Did not take HTN medicine this morning   Review of Systems  Positive: Abdominal pain, nausea, vomiting  Negative: Fever, chest pain, SOB  Physical Exam  BP (!) 238/215 (BP Location: Left Arm)   Pulse 60   Temp 98.7 F (37.1 C) (Oral)   Resp 15   SpO2 100%  Gen:   Awake, in distress from pain. Dry heaving and gagging in room.  Resp:  Normal effort MSK:   Moves extremities without difficulty  Other:  Diffuse abdominal tenderness.   Medical Decision Making  Medically screening exam initiated at 10:38 AM.  Appropriate orders placed.  Veronica Pollard was informed that the remainder of the evaluation will be completed by another provider, this initial triage assessment does not replace that evaluation, and the importance of remaining in the ED until their evaluation is complete.  ED abdominal pain set ordered.    Veronica Raring, PA-C 08/24/20 1044    [address] PA-C 08/24/20 1045    Veronica Shanks, MD 08/31/20 2107

## 2020-08-27 ENCOUNTER — Telehealth: Payer: Self-pay | Admitting: Family Medicine

## 2020-08-27 NOTE — Telephone Encounter (Signed)
Patient is calling and had to reschedule her appointment for July due to not being able to come into the office in the afternoons. Patient would like for Dr. Janus Molder to call her discuss the results from her last labs.   The best call back is [phone]

## 2020-08-30 ENCOUNTER — Ambulatory Visit: Payer: Managed Care, Other (non HMO) | Admitting: Family Medicine

## 2020-08-31 NOTE — Telephone Encounter (Signed)
Discussed results with patient. Will follow up 7/7 and discuss increasing statin.   Veronica Medinger Autry-Lott, DO 08/31/2020, 4:04 PM PGY-2, [place]

## 2020-09-22 NOTE — Progress Notes (Signed)
    SUBJECTIVE:   CHIEF COMPLAINT / HPI:   Veronica Pollard is a 50  yo F who presents for the following:  Hypertension: - Medications: Metoprolol 50 mg daily  - Compliance: Yes - Checking BP at home: Yes 150-160/80 - Denies any SOB, CP, vision changes, LE edema, medication SEs, or symptoms of hypotension - Exercise: 2x daily (spinning, running, weightlifting) - Statin: Pravastatin 40 mg qhs  PERTINENT  PMH / PSH: IBS, HLD  OBJECTIVE:   BP (!) 153/89   Pulse (!) 48   Wt 230 lb (104.3 kg)   SpO2 96%   BMI 33.97 kg/m   General: Appears well, no acute distress. Age appropriate. Cardiac: RRR, normal heart sounds, no murmurs Respiratory: CTAB, normal effort Extremities: No edema or cyanosis.  ASSESSMENT/PLAN:   HYPERTENSION, BENIGN SYSTEMIC BP remains elevated with recheck today.  -Increase metoprolol to 100 mg daily  -F/u in 2 weeks for BP check -Consider adding new medication or starting different antihypertensive for better control  HYPERCHOLESTEROLEMIA LDL 133 -Increase pravastatin to 80 mg qhs   Bradycardia HR today 62>48 during BP recheck. Patient denies feeling dizzy or lightheaded both at rest and with activity. She is very active and works out 2x daily for 3-5 days/wk. Will continue to monitor at subsequent visit. Do not increase metoprolol further as above.   Gerlene Fee, [place]

## 2020-09-22 NOTE — Patient Instructions (Addendum)
It was wonderful to see you today.  Please bring ALL of your medications with you to every visit.   Today we talked about:  Your blood pressure goal is 140/90 and what signs of high blood pressure are. We increased your metoprolol to 100 mg daily.   We increased your statin to 80 mg. Can continue 2 tablets of the 40 mg until the ones you have run out.   Follow up in 2 weeks for BP check. This will be nurse visit.   If you haven't already, sign up for My Chart to have easy access to your labs results, and communication with your primary care physician.  Please call the clinic at [phone] if you have any concerns. It was our pleasure to serve you.  Dr. Janus Molder

## 2020-09-23 ENCOUNTER — Other Ambulatory Visit: Payer: Self-pay

## 2020-09-23 ENCOUNTER — Encounter: Payer: Self-pay | Admitting: Family Medicine

## 2020-09-23 ENCOUNTER — Ambulatory Visit: Payer: Managed Care, Other (non HMO) | Admitting: Family Medicine

## 2020-09-23 VITALS — BP 153/89 | HR 48 | Wt 230.0 lb

## 2020-09-23 DIAGNOSIS — I10 Essential (primary) hypertension: Secondary | ICD-10-CM

## 2020-09-23 DIAGNOSIS — R001 Bradycardia, unspecified: Secondary | ICD-10-CM | POA: Insufficient documentation

## 2020-09-23 DIAGNOSIS — E7800 Pure hypercholesterolemia, unspecified: Secondary | ICD-10-CM | POA: Diagnosis not present

## 2020-09-23 MED ORDER — PRAVASTATIN SODIUM 80 MG PO TABS: 80.0000 mg | ORAL_TABLET | Freq: Every day | ORAL | 3 refills | Status: DC

## 2020-09-23 MED ORDER — METOPROLOL SUCCINATE ER 100 MG PO TB24: 100.0000 mg | ORAL_TABLET | Freq: Every day | ORAL | 3 refills | Status: DC

## 2020-09-23 NOTE — Assessment & Plan Note (Signed)
BP remains elevated with recheck today.  -Increase metoprolol to 100 mg daily  -F/u in 2 weeks for BP check -Consider adding new medication or starting different antihypertensive for better control

## 2020-09-23 NOTE — Assessment & Plan Note (Signed)
LDL 133 -Increase pravastatin to 80 mg qhs

## 2020-09-23 NOTE — Assessment & Plan Note (Signed)
HR today 62>48 during BP recheck. Patient denies feeling dizzy or lightheaded both at rest and with activity. She is very active and works out 2x daily for 3-5 days/wk. Will continue to monitor at subsequent visit. Do not increase metoprolol further as above.

## 2020-10-06 ENCOUNTER — Ambulatory Visit
Admission: RE | Admit: 2020-10-06 | Discharge: 2020-10-06 | Disposition: A | Discharge status: Home / Self Care | Payer: Managed Care, Other (non HMO) | Source: Ambulatory Visit | Attending: Gastroenterology | Admitting: Gastroenterology

## 2020-10-06 ENCOUNTER — Other Ambulatory Visit: Payer: Self-pay

## 2020-10-06 ENCOUNTER — Other Ambulatory Visit: Payer: Self-pay | Admitting: Gastroenterology

## 2020-10-06 DIAGNOSIS — R109 Unspecified abdominal pain: Secondary | ICD-10-CM

## 2020-11-17 ENCOUNTER — Telehealth: Payer: Self-pay | Admitting: Hematology and Oncology

## 2020-11-17 NOTE — Telephone Encounter (Signed)
Scheduled appt per 8/30 referral. Pt is aware of appt date and time. Per pt's request I mailed updated calendar to pt.

## 2020-11-29 ENCOUNTER — Encounter: Payer: Managed Care, Other (non HMO) | Admitting: Hematology and Oncology

## 2020-11-29 ENCOUNTER — Other Ambulatory Visit: Payer: Managed Care, Other (non HMO)

## 2021-04-05 ENCOUNTER — Other Ambulatory Visit: Payer: Self-pay | Admitting: Family Medicine

## 2021-04-05 ENCOUNTER — Other Ambulatory Visit: Payer: Self-pay | Admitting: Critical Care Medicine

## 2021-04-05 DIAGNOSIS — Z1231 Encounter for screening mammogram for malignant neoplasm of breast: Secondary | ICD-10-CM

## 2021-04-14 ENCOUNTER — Ambulatory Visit: Payer: Managed Care, Other (non HMO)

## 2021-04-21 ENCOUNTER — Other Ambulatory Visit: Payer: Self-pay | Admitting: Family Medicine

## 2021-04-21 DIAGNOSIS — Z1231 Encounter for screening mammogram for malignant neoplasm of breast: Secondary | ICD-10-CM

## 2021-05-05 ENCOUNTER — Ambulatory Visit
Admission: RE | Admit: 2021-05-05 | Discharge: 2021-05-05 | Disposition: A | Discharge status: Home / Self Care | Payer: Managed Care, Other (non HMO) | Source: Ambulatory Visit | Attending: Critical Care Medicine | Admitting: Critical Care Medicine

## 2021-05-05 DIAGNOSIS — Z1231 Encounter for screening mammogram for malignant neoplasm of breast: Secondary | ICD-10-CM

## 2021-10-03 ENCOUNTER — Other Ambulatory Visit: Payer: Self-pay | Admitting: Family Medicine

## 2021-10-03 DIAGNOSIS — I10 Essential (primary) hypertension: Secondary | ICD-10-CM

## 2021-10-04 ENCOUNTER — Other Ambulatory Visit: Payer: Self-pay | Admitting: Family Medicine

## 2021-10-04 DIAGNOSIS — E7800 Pure hypercholesterolemia, unspecified: Secondary | ICD-10-CM

## 2021-10-05 ENCOUNTER — Other Ambulatory Visit: Payer: Self-pay | Admitting: Family Medicine

## 2021-10-05 DIAGNOSIS — I10 Essential (primary) hypertension: Secondary | ICD-10-CM

## 2022-01-05 ENCOUNTER — Ambulatory Visit: Payer: Managed Care, Other (non HMO) | Admitting: Podiatry

## 2022-01-05 DIAGNOSIS — L600 Ingrowing nail: Secondary | ICD-10-CM | POA: Diagnosis not present

## 2022-01-05 NOTE — Patient Instructions (Signed)

## 2022-01-05 NOTE — Progress Notes (Signed)
  Subjective:  Patient ID: Veronica Pollard, female    DOB: [birth year],  MRN: [id number]  Chief Complaint  Patient presents with   Ingrown Toenail    Patient is here for bilateral ingrown toe nails great toe nail, patient states that she wants them removed     51 y.o. female presents with pain in the left great toenail. She has pain at both borders. This has been going on for a few weeks. Has previously had ingrown nail treated at another practice on the right great toenail. Denies redness swelling or drainage just having pain with pressure / shoes on left great toe.   Past Medical History:  Diagnosis Date   High cholesterol    Hypertension    Osteoarthritis    Renal disorder    early stage kidney disease    Allergies  Allergen Reactions   Penicillins     REACTION: Swollen throat, difficult breathing   Penicillins     ROS: Negative except as per HPI above  Objective:  General: AAO x3, NAD  Dermatological: Incurvation is present along the bilateral nail border of the Left great toe. There is localized edema without any erythema or increase in warmth around the nail border. There is no drainage or pus. There is no ascending cellulitis. No malodor. No open lesions or pre-ulcerative lesions.    Vascular:  Dorsalis Pedis artery and Posterior Tibial artery pedal pulses are 2/4 bilateral.  Capillary fill time < 3 sec to all digits.   Neruologic: Grossly intact via light touch bilateral. Protective threshold intact to all sites bilateral.   Musculoskeletal: No gross boney pedal deformities bilateral. No pain, crepitus, or limitation noted with foot and ankle range of motion bilateral. Muscular strength 5/5 in all groups tested bilateral.  Gait: Unassisted, Nonantalgic.   No images are attached to the encounter.  Assessment:   1. Ingrown nail of great toe of left foot      Plan:  Patient was evaluated and treated and all questions answered.  Ingrown Nail, left Hallux  bilateral border -Patient elects to proceed with minor surgery to remove ingrown toenail today. Consent reviewed and signed by patient. -Ingrown nail excised. See procedure note. -Educated on post-procedure care including soaking. Written instructions provided and reviewed. -Patient to follow up in 2 weeks for nail check.  Procedure: Excision of Ingrown Toenail Location: Left 1st toe  bilateral  nail borders. Anesthesia: Lidocaine 1% plain; 1.5 mL and Marcaine 0.5% plain; 1.5 mL, digital block. Skin Prep: Betadine. Dressing: Silvadene; telfa; dry, sterile, compression dressing. Technique: Following skin prep, the toe was exsanguinated and a tourniquet was secured at the base of the toe. The affected nail border was freed, split with a nail splitter, and excised. Chemical matrixectomy was then performed with phenol and irrigated out with alcohol. The tourniquet was then removed and sterile dressing applied. Disposition: Patient tolerated procedure well. Patient to return in 2 weeks for follow-up.    Return in about 2 weeks (around 01/19/2022) for follow up left hallux ingrown.          Everitt Amber, DPM [facility]

## 2022-01-17 ENCOUNTER — Ambulatory Visit: Payer: Self-pay

## 2022-01-17 ENCOUNTER — Ambulatory Visit: Payer: Managed Care, Other (non HMO) | Admitting: Orthopaedic Surgery

## 2022-01-17 ENCOUNTER — Ambulatory Visit (INDEPENDENT_AMBULATORY_CARE_PROVIDER_SITE_OTHER): Payer: Managed Care, Other (non HMO)

## 2022-01-17 VITALS — BP 140/109 | HR 89

## 2022-01-17 DIAGNOSIS — M1611 Unilateral primary osteoarthritis, right hip: Secondary | ICD-10-CM | POA: Diagnosis not present

## 2022-01-17 MED ORDER — METHYLPREDNISOLONE ACETATE 40 MG/ML IJ SUSP
80.0000 mg | INTRAMUSCULAR | Status: AC | PRN
  Administered 2022-01-17: 80 mg via INTRA_ARTICULAR

## 2022-01-17 MED ORDER — LIDOCAINE HCL 1 % IJ SOLN
4.0000 mL | INTRAMUSCULAR | Status: AC | PRN
  Administered 2022-01-17: 4 mL

## 2022-01-17 NOTE — Progress Notes (Signed)
Office Visit Note   Patient: Veronica Pollard           Date of Birth: [birth year]           MRN: [id number] Visit Date: 01/17/2022              Requested by: Colletta Maryland, MD [place],  [place] [address] PCP: Colletta Maryland, MD   Assessment & Plan: Visit Diagnoses:  1. Primary osteoarthritis of right hip     Plan: Impression is advanced right hip osteoarthritis.  She is bone-on-bone based on x-rays.  Since her last injection was very effective we will try another 1 today.  We will send her to Dr. Rolena Infante for this.  Follow-up as needed.  Follow-Up Instructions: No follow-ups on file.   Orders:  Orders Placed This Encounter  Procedures   XR HIP UNILAT W OR W/O PELVIS 2-3 VIEWS RIGHT   No orders of the defined types were placed in this encounter.     Procedures: No procedures performed   Clinical Data: No additional findings.   Subjective: Chief Complaint  Patient presents with   Right Hip - Pain    HPI Pleas Koch returns today for recurrent right hip pain from severe osteoarthritis.  She had a cortisone injection about 2 years ago and started having pain just 2 months ago.  She would like to have another one.  She says her leg locks up when she tries to flex her hip to put on her shoes.  Review of Systems  Constitutional: Negative.   HENT: Negative.    Eyes: Negative.   Respiratory: Negative.    Cardiovascular: Negative.   Endocrine: Negative.   Musculoskeletal: Negative.   Neurological: Negative.   Hematological: Negative.   Psychiatric/Behavioral: Negative.    All other systems reviewed and are negative.    Objective: Vital Signs: There were no vitals taken for this visit.  Physical Exam Vitals and nursing note reviewed.  Constitutional:      Appearance: She is well-developed.  Pulmonary:     Effort: Pulmonary effort is normal.  Skin:    General: Skin is warm.     Capillary Refill: Capillary refill takes less than 2 seconds.   Neurological:     Mental Status: She is alert and oriented to person, place, and time.  Psychiatric:        Behavior: Behavior normal.        Thought Content: Thought content normal.        Judgment: Judgment normal.     Ortho Exam Examination of the right hip shows significant pain with attempted range of motion and hip flexion.  Antalgic gait. Specialty Comments:  No specialty comments available.  Imaging: XR HIP UNILAT W OR W/O PELVIS 2-3 VIEWS RIGHT  Result Date: 01/17/2022 Advanced degenerative joint disease with bone-on-bone joint space narrowing.    PMFS History: Patient Active Problem List   Diagnosis Date Noted   Bradycardia 09/23/2020   Sleep apnea 08/11/2020   Arthritis 08/11/2020   Prediabetes 10/03/2018   BMI 33.0-33.9,adult 10/03/2018   Bilateral hip pain 08/22/2018   Neck pain 08/22/2018   Unilateral primary osteoarthritis, right hip 12/04/2017   HYPERCHOLESTEROLEMIA 05/25/2010   FIBROIDS, UTERUS 05/12/2010   EXTERNAL HEMORRHOIDS 05/12/2010   IRRITABLE BOWEL SYNDROME 05/12/2010   CALLUSES, LEFT FOOT 05/12/2010   DEGENERATIVE DISC DISEASE, CERVICAL SPINE 05/12/2010   CARPAL TUNNEL SYNDROME, BILATERAL, HX OF 05/12/2010   OBESITY, NOS 05/17/2006   ANEMIA, IRON DEFICIENCY, UNSPEC.  05/17/2006   HYPERTENSION, BENIGN SYSTEMIC 05/17/2006   Past Medical History:  Diagnosis Date   High cholesterol    Hypertension    Osteoarthritis    Renal disorder    early stage kidney disease    Family History  Problem Relation Age of Onset   Breast cancer Mother    Diabetes Mother    High blood pressure Mother    High Cholesterol Mother    Clotting disorder Father    Heart disease Father    Diabetes Father    Early death Father    High Cholesterol Father    High blood pressure Father    Breast cancer Maternal Aunt    Alcohol abuse Sister    Depression Sister    High blood pressure Sister    Drug abuse Sister     Past Surgical History:  Procedure  Laterality Date   ABDOMINAL HYSTERECTOMY     BREAST BIOPSY Right 2014   benign   CARPAL TUNNEL RELEASE     CHOLECYSTECTOMY     COLONOSCOPY     HAMMER TOE SURGERY     Social History   Occupational History   Not on file  Tobacco Use   Smoking status: Light Smoker    Types: Cigarettes   Smokeless tobacco: Never   Tobacco comments:    1-2 cigarettes a day  Substance and Sexual Activity   Alcohol use: Yes    Comment: socially   Drug use: No   Sexual activity: Not on file

## 2022-01-17 NOTE — Progress Notes (Signed)
   Procedure Note  Patient: Veronica Pollard             Date of Birth: [birth year]           MRN: [id number]             Visit Date: 01/17/2022  Procedures: Visit Diagnoses:  1. Primary osteoarthritis of right hip     Large Joint Inj: R hip joint on 01/17/2022 9:31 AM Indications: pain Details: 22 G 3.5 in needle, ultrasound-guided anterior approach Medications: 4 mL lidocaine 1 %; 80 mg methylPREDNISolone acetate 40 MG/ML Outcome: tolerated well, no immediate complications  Procedure: US-guided intra-articular hip injection, right After discussion on risks/benefits/indications and informed verbal consent was obtained, a timeout was performed. Patient was lying supine on exam table. The hip was cleaned with betadine and alcohol swabs. Then utilizing ultrasound guidance, the patient's femoral head and neck junction was identified and subsequently injected with 4:2 lidocaine:depomedrol via an in-plane approach with ultrasound visualization of the injectate administered into the hip joint. Patient tolerated procedure well without immediate complications.  Procedure, treatment alternatives, risks and benefits explained, specific risks discussed. Consent was given by the patient. Immediately prior to procedure a time out was called to verify the correct patient, procedure, equipment, support staff and site/side marked as required. Patient was prepped and draped in the usual sterile fashion.     - I evaluated the patient about 10 minutes post-injection and she had improvement in pain and range of motion - follow-up with Dr. Erlinda Hong as indicated; I am happy to see them as needed  Elba Barman, DO [place]  This note was dictated using Dragon naturally speaking software and may contain errors in syntax, spelling, or content which have not been identified prior to signing this note.

## 2022-01-19 ENCOUNTER — Ambulatory Visit: Payer: Medicare Other | Admitting: Podiatry

## 2022-04-21 ENCOUNTER — Encounter: Payer: Self-pay | Admitting: Family Medicine

## 2022-04-21 ENCOUNTER — Other Ambulatory Visit: Payer: Self-pay | Admitting: Family Medicine

## 2022-04-21 DIAGNOSIS — Z1231 Encounter for screening mammogram for malignant neoplasm of breast: Secondary | ICD-10-CM

## 2022-04-26 ENCOUNTER — Ambulatory Visit (INDEPENDENT_AMBULATORY_CARE_PROVIDER_SITE_OTHER): Payer: Managed Care, Other (non HMO) | Admitting: Student

## 2022-04-26 VITALS — BP 134/60 | HR 57 | Wt 247.6 lb

## 2022-04-26 DIAGNOSIS — I10 Essential (primary) hypertension: Secondary | ICD-10-CM

## 2022-04-26 DIAGNOSIS — K589 Irritable bowel syndrome without diarrhea: Secondary | ICD-10-CM

## 2022-04-26 DIAGNOSIS — Z0000 Encounter for general adult medical examination without abnormal findings: Secondary | ICD-10-CM

## 2022-04-26 DIAGNOSIS — D509 Iron deficiency anemia, unspecified: Secondary | ICD-10-CM

## 2022-04-26 DIAGNOSIS — R7309 Other abnormal glucose: Secondary | ICD-10-CM | POA: Diagnosis not present

## 2022-04-26 DIAGNOSIS — Z1159 Encounter for screening for other viral diseases: Secondary | ICD-10-CM

## 2022-04-26 DIAGNOSIS — Z1211 Encounter for screening for malignant neoplasm of colon: Secondary | ICD-10-CM

## 2022-04-26 DIAGNOSIS — E7800 Pure hypercholesterolemia, unspecified: Secondary | ICD-10-CM

## 2022-04-26 DIAGNOSIS — Z01818 Encounter for other preprocedural examination: Secondary | ICD-10-CM

## 2022-04-26 DIAGNOSIS — Z114 Encounter for screening for human immunodeficiency virus [HIV]: Secondary | ICD-10-CM | POA: Diagnosis not present

## 2022-04-26 LAB — POCT GLYCOSYLATED HEMOGLOBIN (HGB A1C): Hemoglobin A1C: 5.8 % — AB (ref 4.0–5.6)

## 2022-04-26 MED ORDER — DICYCLOMINE HCL 20 MG PO TABS: 20.0000 mg | ORAL_TABLET | Freq: Three times a day (TID) | ORAL | 3 refills | Status: DC | PRN

## 2022-04-26 MED ORDER — METOPROLOL SUCCINATE ER 100 MG PO TB24: ORAL_TABLET | ORAL | 0 refills | Status: DC

## 2022-04-26 MED ORDER — ZOSTER VAC RECOMB ADJUVANTED 50 MCG/0.5ML IM SUSR: 0.5000 mL | Freq: Once | INTRAMUSCULAR | 0 refills | Status: AC

## 2022-04-26 NOTE — Assessment & Plan Note (Signed)
No cardiac history to preclude her from surgery. Will obtain a basic lab panel today to include: CMP, CBC, A1c, Lipids

## 2022-04-26 NOTE — Assessment & Plan Note (Signed)
Odd regimen of metoprolol monotherapy, but BP is WNL today and HR seems to be tolerating it reasonably well (57), asymptomatic, so will not touch this for now. Should she become hypertensive in the future, would favor stopping metoprolol altogether and transition to amlodipine-olmesartan.

## 2022-04-26 NOTE — Patient Instructions (Addendum)
I have sent a referral to [facility] for your colonoscopy. They will call you to get this set.   We are getting some lab work today--I will call you if we need to make any changes.   I am going to enjoy being your doctor!   Marnee Guarneri, MD

## 2022-04-26 NOTE — Progress Notes (Signed)
    SUBJECTIVE:   Chief compliant/HPI: annual examination  Veronica Pollard is a 52 y.o. who presents today for an annual exam.   Also, looking forward to having hip replacement surgery. Would like to have pre-surgical labwork done today.   She is overall quite healthy.  Previously weight up to 400 pounds, has since taken charge of her health and goes to the gym frequently.  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  Places great emphasis on her health and wants to stay as healthy as she can as long as she can. History tabs reviewed and updated.   Review of systems form reviewed and notable for chronic joint pains, otherwise negative.   OBJECTIVE:   BP 134/60   Pulse (!) 57   Wt 247 lb 9.6 oz (112.3 kg)   SpO2 98%   BMI 36.56 kg/m   Physical Exam Vitals reviewed.  Constitutional:      General: She is not in acute distress. Cardiovascular:     Rate and Rhythm: Normal rate and regular rhythm.     Heart sounds: No murmur heard.    No friction rub. No gallop.  Skin:    General: Skin is warm and dry.     Capillary Refill: Capillary refill takes less than 2 seconds.  Neurological:     General: No focal deficit present.     Mental Status: She is alert.  Psychiatric:        Mood and Affect: Mood normal.      ASSESSMENT/PLAN:   HYPERTENSION, BENIGN SYSTEMIC Odd regimen of metoprolol monotherapy, but BP is WNL today and HR seems to be tolerating it reasonably well (57), asymptomatic, so will not touch this for now. Should she become hypertensive in the future, would favor stopping metoprolol altogether and transition to amlodipine-olmesartan.  Pre-op evaluation No cardiac history to preclude her from surgery. Will obtain a basic lab panel today to include: CMP, CBC, A1c, Lipids    Annual Examination  See AVS for age appropriate recommendations  PHQ score 4, reviewed and discussed.  BP reviewed and at goal.   Considered the following items based upon USPSTF  recommendations: Diabetes screening: ordered Screening for elevated cholesterol: ordered HIV testing: ordered Hepatitis C: ordered Hepatitis B:  not indicated Syphilis if at high risk:  not indicated GC/CT  not indicated Osteoporosis screening considered based upon risk of fracture from [facility] calculator. Major    Cervical cancer screening:  not indicated Breast cancer screening:  scheduled Colorectal cancer screening: discussed, colonoscopy ordered Lung cancer screening:  not indicated .   Vaccinations Shingles vaccine ordered today  Follow up in 1 year or sooner if indicated.    Veronica Guarneri, MD [place]

## 2022-04-27 ENCOUNTER — Telehealth: Payer: Self-pay

## 2022-04-27 LAB — COMPREHENSIVE METABOLIC PANEL
ALT: 9 IU/L (ref 0–32)
AST: 15 IU/L (ref 0–40)
Albumin/Globulin Ratio: 1.6 (ref 1.2–2.2)
Albumin: 4.2 g/dL (ref 3.8–4.9)
Alkaline Phosphatase: 88 IU/L (ref 44–121)
BUN/Creatinine Ratio: 15 (ref 9–23)
BUN: 14 mg/dL (ref 6–24)
Bilirubin Total: 0.3 mg/dL (ref 0.0–1.2)
CO2: 22 mmol/L (ref 20–29)
Calcium: 9.1 mg/dL (ref 8.7–10.2)
Chloride: 104 mmol/L (ref 96–106)
Creatinine, Ser: 0.94 mg/dL (ref 0.57–1.00)
Globulin, Total: 2.7 g/dL (ref 1.5–4.5)
Glucose: 95 mg/dL (ref 70–99)
Potassium: 4.3 mmol/L (ref 3.5–5.2)
Sodium: 140 mmol/L (ref 134–144)
Total Protein: 6.9 g/dL (ref 6.0–8.5)
eGFR: 73 mL/min/{1.73_m2} (ref >59)

## 2022-04-27 LAB — LIPID PANEL
Chol/HDL Ratio: 3.9 ratio (ref 0.0–4.4)
Cholesterol, Total: 181 mg/dL (ref 100–199)
HDL: 47 mg/dL (ref >39)
LDL Chol Calc (NIH): 121 mg/dL — ABNORMAL HIGH (ref 0–99)
Triglycerides: 70 mg/dL (ref 0–149)
VLDL Cholesterol Cal: 13 mg/dL (ref 5–40)

## 2022-04-27 LAB — CBC
Hematocrit: 34.7 % (ref 34.0–46.6)
Hemoglobin: 10.7 g/dL — ABNORMAL LOW (ref 11.1–15.9)
MCH: 23.9 pg — ABNORMAL LOW (ref 26.6–33.0)
MCHC: 30.8 g/dL — ABNORMAL LOW (ref 31.5–35.7)
MCV: 78 fL — ABNORMAL LOW (ref 79–97)
Platelets: 279 10*3/uL (ref 150–450)
RBC: 4.47 x10E6/uL (ref 3.77–5.28)
RDW: 16.9 % — ABNORMAL HIGH (ref 11.7–15.4)
WBC: 7.9 10*3/uL (ref 3.4–10.8)

## 2022-04-27 LAB — HCV INTERPRETATION

## 2022-04-27 LAB — HIV ANTIBODY (ROUTINE TESTING W REFLEX): HIV Screen 4th Generation wRfx: NONREACTIVE

## 2022-04-27 LAB — HCV AB W REFLEX TO QUANT PCR: HCV Ab: NONREACTIVE

## 2022-04-27 NOTE — Telephone Encounter (Signed)
Patient calls nurse line requesting to speak with Dr. Joelyn Oms regarding test results.   Please return call to patient at [phone].  Talbot Grumbling, RN

## 2022-04-28 MED ORDER — ROSUVASTATIN CALCIUM 20 MG PO TABS: 20.0000 mg | ORAL_TABLET | Freq: Every day | ORAL | 3 refills | Status: DC

## 2022-04-28 NOTE — Telephone Encounter (Signed)
Patient calls nurse line regarding results. Patient reports high levels on anxiety with seeing results on mychart and wanting to discuss this with provider.   Patient states that if she does not hear back from our office today she will establish care elsewhere.   Advised of turn around time for results and that I would send message to provider.   Talbot Grumbling, RN

## 2022-04-28 NOTE — Addendum Note (Signed)
Addended by: Jim Like B on: 04/28/2022 04:32 PM   Modules accepted: Orders

## 2022-05-01 ENCOUNTER — Ambulatory Visit (INDEPENDENT_AMBULATORY_CARE_PROVIDER_SITE_OTHER): Payer: Managed Care, Other (non HMO) | Admitting: Family Medicine

## 2022-05-01 ENCOUNTER — Other Ambulatory Visit: Payer: Managed Care, Other (non HMO)

## 2022-05-01 VITALS — BP 180/90 | HR 73 | Ht 69.0 in | Wt 247.0 lb

## 2022-05-01 DIAGNOSIS — I10 Essential (primary) hypertension: Secondary | ICD-10-CM | POA: Diagnosis not present

## 2022-05-01 DIAGNOSIS — M79601 Pain in right arm: Secondary | ICD-10-CM | POA: Diagnosis not present

## 2022-05-01 MED ORDER — CYCLOBENZAPRINE HCL 5 MG PO TABS: 5.0000 mg | ORAL_TABLET | Freq: Three times a day (TID) | ORAL | 0 refills | Status: AC | PRN

## 2022-05-01 NOTE — Patient Instructions (Addendum)
It was great seeing you today!  Today we discussed your symptoms, I am sorry that you are not well. I am not sure what the cause of your symptoms is but I have ruled out the serious conditions that can be dangerous and emergent. It may be due to nerve pain or muscle spasms, I have prescribed flexeril. You can take this no more than 3 times a day. Please do not drive or operate heavy machinery after taking this as it can make you feel sleepy.   Your blood pressure is also a bit high, I believe this is due to your symptoms. Please follow up in 1 week to recheck this and see how your symptoms are doing.   Please follow up at your next scheduled appointment on 2/19 at 8:30 am, if anything arises between now and then, please don't hesitate to contact our office.   Thank you for allowing [place] to be a part of your medical care!  Thank you, Dr. Larae Grooms  Also a reminder of our clinic's no-show policy. Please make sure to arrive at least 15 minutes prior to your scheduled appointment time. Please try to cancel before 24 hours if you are not able to make it. If you no-show for 2 appointments then you will be receiving a warning letter. If you no-show after 3 visits, then you may be at risk of being dismissed from our clinic. This is to ensure that everyone is able to be seen in a timely manner. Thank you, we appreciate your assistance with this!

## 2022-05-01 NOTE — Progress Notes (Unsigned)
    SUBJECTIVE:   CHIEF COMPLAINT / HPI:   Patient had blood work performed on Wednesday of last week, presents with right arm pain since then. The pain started a few hours after she had blood work done. The pain in her arm seems to be getting progressively worse. Also has some swelling in her left foot that started around the same time as well. Limited range of motion as well. She had to take off her rings due to swelling in her fingers of the right hand. She has had blood drawn before but has never experienced this pain before. Reports numbness, tingling and throbbing sensation. This sensation seems to be worsened with movement but the pain seems to be ongoing.   OBJECTIVE:   BP (!) 180/90   Pulse 73   Ht '5\' 9"'$  (1.753 m)   Wt 247 lb (112 kg)   SpO2 98%   BMI 36.48 kg/m   General: Patient well-groomed, in no acute distress. CV: RRR, no murmurs or gallops auscultated Resp: CTAB, no wheezing, rales or rhonchi noted MSK: no edema or erythema noted within right anterior cubital fossa or elsewhere, no point tenderness noted, full passive ROM of wrists and elbows bilaterally, full active ROM of neck and shoulders bilaterally, negative Spurling's testing bilaterally  Neuro: 5/5 UE strength bilaterally, gross sensation intact   ASSESSMENT/PLAN:   Upper extremity pain -unclear as to exact etiology, likely pain further exacerbated by chronic arthritis and degenerative disc disease. Possible neuopathc component. Also considered cervical radiculopathy but distribution seems inconsistent with this along with negative Spurling's test. Low to no suspicion for necrotizing fascitis and stroke given focal findings.  -no indication for imaging at this time, treat conservatively  -flexeril started, consider trial of gabapentin if still persists -close follow up scheduled with PCP on 2/19   HYPERTENSION, BENIGN SYSTEMIC -elevated BP reading, of note patient has not taken her morning BP meds but likely  secondary to pain she is experiencing -continue current antihypertensive regimen -follow up on 2/19 for BP check, consider initiation of ARB if indicated     -PHQ-9 score of 0 reviewed.     Donney Dice, [place]

## 2022-05-02 DIAGNOSIS — M79603 Pain in arm, unspecified: Secondary | ICD-10-CM | POA: Insufficient documentation

## 2022-05-02 NOTE — Assessment & Plan Note (Addendum)
-  elevated BP reading, of note patient has not taken her morning BP meds but likely secondary to pain she is experiencing -continue current antihypertensive regimen -follow up on 2/19 for BP check, consider initiation of ARB if indicated

## 2022-05-02 NOTE — Assessment & Plan Note (Signed)
-  unclear as to exact etiology, likely pain further exacerbated by chronic arthritis and degenerative disc disease. Possible neuopathc component. Also considered cervical radiculopathy but distribution seems inconsistent with this along with negative Spurling's test. Low to no suspicion for necrotizing fascitis and stroke given focal findings.  -no indication for imaging at this time, treat conservatively  -flexeril started, consider trial of gabapentin if still persists -close follow up scheduled with PCP on 2/19

## 2022-05-08 ENCOUNTER — Ambulatory Visit: Payer: Self-pay | Admitting: Student

## 2022-05-24 ENCOUNTER — Other Ambulatory Visit: Payer: Self-pay | Admitting: Student

## 2022-05-24 DIAGNOSIS — I10 Essential (primary) hypertension: Secondary | ICD-10-CM

## 2022-05-26 ENCOUNTER — Ambulatory Visit
Admission: RE | Admit: 2022-05-26 | Discharge: 2022-05-26 | Disposition: A | Discharge status: Home / Self Care | Payer: Managed Care, Other (non HMO) | Source: Ambulatory Visit | Attending: Family Medicine | Admitting: Family Medicine

## 2022-05-26 DIAGNOSIS — Z1231 Encounter for screening mammogram for malignant neoplasm of breast: Secondary | ICD-10-CM

## 2022-06-22 ENCOUNTER — Telehealth: Payer: Self-pay | Admitting: Orthopaedic Surgery

## 2022-06-22 NOTE — Telephone Encounter (Signed)
Patient would like to try gel injections in her neck and her hips. She has never had them but her sister told her to ask about it. Patient also wants to know why she was never told about the Gel injection. Please advise. Patient also states she wants pain medication and to call it into [facility] on [address].

## 2022-06-22 NOTE — Telephone Encounter (Signed)
No gel injections are given in the neck or hip.

## 2022-06-23 ENCOUNTER — Other Ambulatory Visit: Payer: Self-pay | Admitting: Physician Assistant

## 2022-06-23 MED ORDER — TRAMADOL HCL 50 MG PO TABS: 50.0000 mg | ORAL_TABLET | Freq: Three times a day (TID) | ORAL | 0 refills | Status: DC | PRN

## 2022-06-23 NOTE — Telephone Encounter (Signed)
Sent in tramadol

## 2022-06-26 NOTE — Progress Notes (Unsigned)
Office Visit Note   Patient: Veronica Pollard           Date of Birth: 1970/11/15           MRN: [id number] Visit Date: 06/27/2022              Requested by: Alicia Amel, MD [address] [place],  [place] [address] PCP: Alicia Amel, MD   Assessment & Plan: Visit Diagnoses:  1. Primary osteoarthritis of right hip     Plan: Impression is severe right hip degenerative joint disease secondary to Osteoarthritis.  Imaging shows bone on bone joint space narrowing.  Steroid injection only lasted 1 week.  At this point, conservative treatments fail to provide any significant relief and the pain is severely affecting ADLs and quality of life.  Based on treatment options, the patient has elected to move forward with a hip replacement.  We have discussed the surgical risks that include but are not limited to infection, DVT, leg length discrepancy, numbness, tingling, incomplete relief of pain.  Recovery and prognosis were also reviewed.    Unfortunately the hip injection only helped for 1 week.  The injection was done about 6 months ago.  Current anticoagulants: aspirin 81 mg daily Postop anticoagulation: Aspirin 81 mg Diabetic: No  Prior DVT/PE: No Tobacco use: No Clearances needed for surgery: None Anticipate discharge dispo: home with husband   Follow-Up Instructions: No follow-ups on file.   Orders:  No orders of the defined types were placed in this encounter.  No orders of the defined types were placed in this encounter.     Procedures: No procedures performed   Clinical Data: No additional findings.   Subjective: No chief complaint on file.   HPI  Laticha returns today for follow-up on right hip bone-on-bone DJD.  Review of Systems  Constitutional: Negative.   HENT: Negative.    Eyes: Negative.   Respiratory: Negative.    Cardiovascular: Negative.   Endocrine: Negative.   Musculoskeletal: Negative.   Neurological: Negative.   Hematological: Negative.    Psychiatric/Behavioral: Negative.    All other systems reviewed and are negative.   Objective: Vital Signs: There were no vitals taken for this visit.  Physical Exam Vitals and nursing note reviewed.  Constitutional:      Appearance: She is well-developed.  HENT:     Head: Normocephalic and atraumatic.  Pulmonary:     Effort: Pulmonary effort is normal.  Abdominal:     Palpations: Abdomen is soft.  Musculoskeletal:     Cervical back: Neck supple.  Skin:    General: Skin is warm.     Capillary Refill: Capillary refill takes less than 2 seconds.  Neurological:     Mental Status: She is alert and oriented to person, place, and time.  Psychiatric:        Behavior: Behavior normal.        Thought Content: Thought content normal.        Judgment: Judgment normal.   Ortho Exam  Examination right hip is unchanged.  Specialty Comments:  No specialty comments available.  Imaging: No results found.   PMFS History: Patient Active Problem List   Diagnosis Date Noted   Upper extremity pain 05/02/2022   Pre-op evaluation 04/26/2022   Bradycardia 09/23/2020   Sleep apnea 08/11/2020   Arthritis 08/11/2020   Prediabetes 10/03/2018   BMI 33.0-33.9,adult 10/03/2018   Bilateral hip pain 08/22/2018   Neck pain 08/22/2018   Unilateral primary osteoarthritis,  right hip 12/04/2017   HYPERCHOLESTEROLEMIA 05/25/2010   FIBROIDS, UTERUS 05/12/2010   EXTERNAL HEMORRHOIDS 05/12/2010   IRRITABLE BOWEL SYNDROME 05/12/2010   CALLUSES, LEFT FOOT 05/12/2010   DEGENERATIVE DISC DISEASE, CERVICAL SPINE 05/12/2010   CARPAL TUNNEL SYNDROME, BILATERAL, HX OF 05/12/2010   OBESITY, NOS 05/17/2006   ANEMIA, IRON DEFICIENCY, UNSPEC. 05/17/2006   HYPERTENSION, BENIGN SYSTEMIC 05/17/2006   Past Medical History:  Diagnosis Date   High cholesterol    Hypertension    Osteoarthritis    Renal disorder    early stage kidney disease    Family History  Problem Relation Age of Onset   Breast  cancer Mother    Diabetes Mother    High blood pressure Mother    High Cholesterol Mother    Clotting disorder Father    Heart disease Father    Diabetes Father    Early death Father    High Cholesterol Father    High blood pressure Father    Breast cancer Maternal Aunt    Alcohol abuse Sister    Depression Sister    High blood pressure Sister    Drug abuse Sister     Past Surgical History:  Procedure Laterality Date   ABDOMINAL HYSTERECTOMY     BREAST BIOPSY Right 2014   benign   CARPAL TUNNEL RELEASE     CHOLECYSTECTOMY     COLONOSCOPY     HAMMER TOE SURGERY     Social History   Occupational History   Not on file  Tobacco Use   Smoking status: Light Smoker    Types: Cigarettes   Smokeless tobacco: Never   Tobacco comments:    1-2 cigarettes a day  Substance and Sexual Activity   Alcohol use: Yes    Comment: socially   Drug use: No   Sexual activity: Not on file

## 2022-06-27 ENCOUNTER — Encounter: Payer: Self-pay | Admitting: Orthopaedic Surgery

## 2022-06-27 ENCOUNTER — Ambulatory Visit: Payer: Managed Care, Other (non HMO) | Admitting: Orthopaedic Surgery

## 2022-06-27 ENCOUNTER — Other Ambulatory Visit (INDEPENDENT_AMBULATORY_CARE_PROVIDER_SITE_OTHER): Payer: Managed Care, Other (non HMO)

## 2022-06-27 VITALS — Ht 69.0 in | Wt 244.0 lb

## 2022-06-27 DIAGNOSIS — M1611 Unilateral primary osteoarthritis, right hip: Secondary | ICD-10-CM | POA: Diagnosis not present

## 2022-06-28 ENCOUNTER — Other Ambulatory Visit: Payer: Self-pay

## 2022-07-13 ENCOUNTER — Other Ambulatory Visit: Payer: Self-pay | Admitting: Physician Assistant

## 2022-07-13 MED ORDER — METHOCARBAMOL 750 MG PO TABS: 750.0000 mg | ORAL_TABLET | Freq: Two times a day (BID) | ORAL | 2 refills | Status: DC | PRN

## 2022-07-13 MED ORDER — OXYCODONE-ACETAMINOPHEN 5-325 MG PO TABS: 1.0000 | ORAL_TABLET | Freq: Four times a day (QID) | ORAL | 0 refills | Status: DC | PRN

## 2022-07-13 MED ORDER — DOCUSATE SODIUM 100 MG PO CAPS: 100.0000 mg | ORAL_CAPSULE | Freq: Every day | ORAL | 2 refills | Status: AC | PRN

## 2022-07-13 MED ORDER — ONDANSETRON HCL 4 MG PO TABS: 4.0000 mg | ORAL_TABLET | Freq: Three times a day (TID) | ORAL | 0 refills | Status: DC | PRN

## 2022-07-13 MED ORDER — ASPIRIN 81 MG PO TBEC: 81.0000 mg | DELAYED_RELEASE_TABLET | Freq: Two times a day (BID) | ORAL | 0 refills | Status: AC

## 2022-07-13 NOTE — Progress Notes (Signed)
Surgical Instructions    Your procedure is scheduled on Monday Jul 24, 2022.  Report to [facility], The Main Entrance "A" at 8:45 A.M., then check in with the Admitting office.  Call this number if you have problems the morning of surgery:  [phone]   If you have any questions prior to your surgery date call [phone]: Open Monday-Friday 8am-4pm If you experience any cold or flu symptoms such as cough, fever, chills, shortness of breath, etc. between now and your scheduled surgery, please notify us at the above number     Remember:  Do not eat after midnight the night before your surgery  You may drink clear liquids until 7:45 the morning of your surgery.   Clear liquids allowed are: Water, Non-Citrus Juices (without pulp), Carbonated Beverages, Clear Tea, Black Coffee ONLY (NO MILK, CREAM OR POWDERED CREAMER of any kind), and Gatorade    Enhanced Recovery after Surgery for Orthopedics Enhanced Recovery after Surgery is a protocol used to improve the stress on your body and your recovery after surgery.  Patient Instructions  The day of surgery (if you do NOT have diabetes):  Drink ONE (1) Pre-Surgery Clear Ensure by 7:45 am the morning of surgery   This drink was given to you during your hospital  pre-op appointment visit. Nothing else to drink after completing the  Pre-Surgery Clear Ensure.        If you have questions, please contact your surgeon's office.   Take these medicines the morning of surgery with A SIP OF WATER:   If needed:  cyclobenzaprine (FLEXERIL)  dicyclomine (BENTYL)  methocarbamol (ROBAXIN-750)  ondansetron (ZOFRAN)   As of today, STOP taking any Aspirin (unless otherwise instructed by your surgeon) Aleve, Naproxen, Ibuprofen, Motrin, Advil, Goody's, BC's, all herbal medications, fish oil, and all vitamins.  Special instructions:    Oral Hygiene is also important to reduce your risk of infection.  Remember - BRUSH YOUR TEETH THE MORNING OF SURGERY WITH  YOUR REGULAR TOOTHPASTE   [name]- Preparing For Surgery  Day of Surgery:  Take a shower with CHG soap. Wear Clean/Comfortable clothing the morning of surgery Do not apply any deodorants/lotions.    Remember to brush your teeth WITH YOUR REGULAR TOOTHPASTE.  Do not wear jewelry or makeup. Do not wear lotions, powders, perfumes/cologne or deodorant. Do not shave 48 hours prior to surgery.  Men may shave face and neck. Do not bring valuables to the hospital. Do not wear nail polish, gel polish, artificial nails, or any other type of covering on natural nails (fingers and toes) If you have artificial nails or gel coating that need to be removed by a nail salon, please have this removed prior to surgery. Artificial nails or gel coating may interfere with anesthesia's ability to adequately monitor your vital signs.  [facility] is not responsible for any belongings or valuables.    Do NOT Smoke (Tobacco/Vaping)  24 hours prior to your procedure  If you use a CPAP at night, you may bring your mask for your overnight stay.   Contacts, glasses, hearing aids, dentures or partials may not be worn into surgery, please bring cases for these belongings   For patients admitted to the hospital, discharge time will be determined by your treatment team.   Patients discharged the day of surgery will not be allowed to drive home, and someone needs to stay with them for 24 hours.   SURGICAL WAITING ROOM VISITATION Patients having surgery or a procedure may  have no more than 2 support people in the waiting area - these visitors may rotate.   Children under the age of 65 must have an adult with them who is not the patient. If the patient needs to stay at the hospital during part of their recovery, the visitor guidelines for inpatient rooms apply. Pre-op nurse will coordinate an appropriate time for 1 support person to accompany patient in pre-op.  This support person may not rotate.   Please  refer to [email or web address] for the visitor guidelines for Inpatients (after your surgery is over and you are in a regular room).   If you received a COVID test during your pre-op visit, it is requested that you wear a mask when out in public, stay away from anyone that may not be feeling well, and notify your surgeon if you develop symptoms. If you have been in contact with anyone that has tested positive in the last 10 days, please notify your surgeon.    Please read over the following fact sheets that you were given.

## 2022-07-14 ENCOUNTER — Encounter (HOSPITAL_COMMUNITY)
Admission: RE | Admit: 2022-07-14 | Discharge: 2022-07-14 | Disposition: A | Discharge status: Home / Self Care | Payer: Managed Care, Other (non HMO) | Source: Ambulatory Visit | Attending: Orthopaedic Surgery | Admitting: Orthopaedic Surgery

## 2022-07-14 ENCOUNTER — Encounter (HOSPITAL_COMMUNITY): Payer: Self-pay

## 2022-07-14 ENCOUNTER — Other Ambulatory Visit: Payer: Self-pay

## 2022-07-14 VITALS — BP 172/136 | HR 57 | Temp 98.7°F | Resp 20 | Ht 69.0 in | Wt 243.1 lb

## 2022-07-14 DIAGNOSIS — I10 Essential (primary) hypertension: Secondary | ICD-10-CM | POA: Insufficient documentation

## 2022-07-14 DIAGNOSIS — Z01818 Encounter for other preprocedural examination: Secondary | ICD-10-CM | POA: Diagnosis present

## 2022-07-14 DIAGNOSIS — M1611 Unilateral primary osteoarthritis, right hip: Secondary | ICD-10-CM | POA: Diagnosis not present

## 2022-07-14 HISTORY — DX: Nausea with vomiting, unspecified: R11.2

## 2022-07-14 HISTORY — DX: Irritable bowel syndrome, unspecified: K58.9

## 2022-07-14 HISTORY — DX: Carpal tunnel syndrome, unspecified upper limb: G56.00

## 2022-07-14 HISTORY — DX: Other specified postprocedural states: Z98.890

## 2022-07-14 HISTORY — DX: Other cervical disc displacement, unspecified cervical region: M50.20

## 2022-07-14 HISTORY — DX: Other complications of anesthesia, initial encounter: T88.59XA

## 2022-07-14 LAB — CBC
HCT: 31.8 % — ABNORMAL LOW (ref 36.0–46.0)
Hemoglobin: 9.9 g/dL — ABNORMAL LOW (ref 12.0–15.0)
MCH: 23.1 pg — ABNORMAL LOW (ref 26.0–34.0)
MCHC: 31.1 g/dL (ref 30.0–36.0)
MCV: 74.3 fL — ABNORMAL LOW (ref 80.0–100.0)
Platelets: 321 10*3/uL (ref 150–400)
RBC: 4.28 MIL/uL (ref 3.87–5.11)
RDW: 16.8 % — ABNORMAL HIGH (ref 11.5–15.5)
WBC: 8.7 10*3/uL (ref 4.0–10.5)
nRBC: 0 % (ref 0.0–0.2)

## 2022-07-14 LAB — BASIC METABOLIC PANEL
Anion gap: 8 (ref 5–15)
BUN: 8 mg/dL (ref 6–20)
CO2: 25 mmol/L (ref 22–32)
Calcium: 8.8 mg/dL — ABNORMAL LOW (ref 8.9–10.3)
Chloride: 108 mmol/L (ref 98–111)
Creatinine, Ser: 1 mg/dL (ref 0.44–1.00)
GFR, Estimated: >60 mL/min (ref >60)
Glucose, Bld: 108 mg/dL — ABNORMAL HIGH (ref 70–99)
Potassium: 3.7 mmol/L (ref 3.5–5.1)
Sodium: 141 mmol/L (ref 135–145)

## 2022-07-14 LAB — NO BLOOD PRODUCTS

## 2022-07-14 LAB — SURGICAL PCR SCREEN
MRSA, PCR: NEGATIVE
Staphylococcus aureus: NEGATIVE

## 2022-07-14 NOTE — Progress Notes (Addendum)
PCP - Darnelle Spangle Cardiologist - Denies  PPM/ICD - Denies Device Orders -  Rep Notified -   Chest x-ray - NI EKG - 07/14/22 Stress Test - Denies ECHO - Denies Cardiac Cath - Denies  Sleep Study - Yes Has OSA diagnosis CPAP - Does not have one  DM - Denies  Blood Thinner Instructions:N/A Aspirin Instructions:N/A  ERAS Protcol -Yes PRE-SURGERY Ensure given   COVID TEST- N/I  Patient denies any respiratory illness/virus in the last two months  Anesthesia review: No  Patient denies shortness of breath, fever, cough and chest pain at PAT appointment   All instructions explained to the patient, with a verbal understanding of the material. Patient agrees to go over the instructions while at home for a better understanding.  The opportunity to ask questions was provided.  Updated patient instructions to arrive at 0630 and to stop clears at 0600.

## 2022-07-19 ENCOUNTER — Other Ambulatory Visit: Payer: Self-pay | Admitting: Physician Assistant

## 2022-07-21 MED ORDER — TRANEXAMIC ACID 1000 MG/10ML IV SOLN
2000.0000 mg | INTRAVENOUS | Status: DC
  Filled 2022-07-21 (×2): qty 20

## 2022-07-23 NOTE — Anesthesia Preprocedure Evaluation (Signed)
Anesthesia Evaluation  Patient identified by MRN, date of birth, ID band Patient awake    Reviewed: Allergy & Precautions, NPO status , Patient's Chart, lab work & pertinent test results  History of Anesthesia Complications (+) PONV and history of anesthetic complications  Airway Mallampati: III  TM Distance: >3 FB Neck ROM: Full    Dental  (+) Dental Advisory Given   Pulmonary neg shortness of breath, sleep apnea , neg COPD, neg recent URI, Current Smoker and Patient abstained from smoking.   Pulmonary exam normal breath sounds clear to auscultation       Cardiovascular hypertension (metoprolol), Pt. on home beta blockers (-) angina (-) Past MI, (-) Cardiac Stents and (-) CABG (-) dysrhythmias  Rhythm:Regular Rate:Normal  HLD   Neuro/Psych neg Seizures  Neuromuscular disease (h/o ruptured cervical disc)    GI/Hepatic Neg liver ROS,neg GERD  ,,IBS   Endo/Other  Prediabetes   Renal/GU Renal disease     Musculoskeletal  (+) Arthritis , Osteoarthritis,    Abdominal  (+) + obese  Peds  Hematology  (+) Blood dyscrasia, anemia , REFUSES BLOOD PRODUCTS  Anesthesia Other Findings Platelets 321  Reproductive/Obstetrics                             Anesthesia Physical Anesthesia Plan  ASA: 3  Anesthesia Plan: MAC and Spinal   Post-op Pain Management: Tylenol PO (pre-op)*   Induction: Intravenous  PONV Risk Score and Plan: 2 and Ondansetron, Dexamethasone, Propofol infusion and Treatment may vary due to age or medical condition  Airway Management Planned: Natural Airway and Simple Face Mask  Additional Equipment:   Intra-op Plan:   Post-operative Plan: Extubation in OR  Informed Consent: I have reviewed the patients History and Physical, chart, labs and discussed the procedure including the risks, benefits and alternatives for the proposed anesthesia with the patient or authorized  representative who has indicated his/her understanding and acceptance.     Dental advisory given  Plan Discussed with: CRNA and Anesthesiologist  Anesthesia Plan Comments: (Blood refusal. Will not accept albumin. Will accept cell saver.  I have discussed risks of neuraxial anesthesia including but not limited to infection, bleeding, nerve injury, back pain, headache, seizures, and failure of block. Patient denies bleeding disorders and is not currently anticoagulated. Labs have been reviewed. Risks and benefits discussed. All patient's questions answered.   Discussed with patient risks of MAC including, but not limited to, minor pain or discomfort, hearing people in the room, and possible need for backup general anesthesia. Risks for general anesthesia also discussed including, but not limited to, sore throat, hoarse voice, chipped/damaged teeth, injury to vocal cords, nausea and vomiting, allergic reactions, lung infection, heart attack, stroke, and death. All questions answered. )       Anesthesia Quick Evaluation

## 2022-07-24 ENCOUNTER — Other Ambulatory Visit: Payer: Self-pay

## 2022-07-24 ENCOUNTER — Observation Stay (HOSPITAL_COMMUNITY)
Admission: RE | Admit: 2022-07-24 | Discharge: 2022-07-25 | Disposition: A | Discharge status: Home / Self Care | Payer: Managed Care, Other (non HMO) | Source: Ambulatory Visit | Attending: Orthopaedic Surgery | Admitting: Orthopaedic Surgery

## 2022-07-24 ENCOUNTER — Encounter (HOSPITAL_COMMUNITY): Payer: Self-pay | Admitting: Orthopaedic Surgery

## 2022-07-24 ENCOUNTER — Ambulatory Visit (HOSPITAL_COMMUNITY): Payer: Managed Care, Other (non HMO)

## 2022-07-24 ENCOUNTER — Ambulatory Visit (HOSPITAL_BASED_OUTPATIENT_CLINIC_OR_DEPARTMENT_OTHER): Payer: Managed Care, Other (non HMO) | Admitting: Anesthesiology

## 2022-07-24 ENCOUNTER — Observation Stay (HOSPITAL_COMMUNITY): Payer: Managed Care, Other (non HMO)

## 2022-07-24 ENCOUNTER — Encounter (HOSPITAL_COMMUNITY)
Admission: RE | Disposition: A | Discharge status: Home / Self Care | Payer: Self-pay | Source: Ambulatory Visit | Attending: Orthopaedic Surgery

## 2022-07-24 ENCOUNTER — Ambulatory Visit (HOSPITAL_COMMUNITY): Payer: Managed Care, Other (non HMO) | Admitting: Anesthesiology

## 2022-07-24 DIAGNOSIS — M659 Synovitis and tenosynovitis, unspecified: Secondary | ICD-10-CM

## 2022-07-24 DIAGNOSIS — F17210 Nicotine dependence, cigarettes, uncomplicated: Secondary | ICD-10-CM | POA: Insufficient documentation

## 2022-07-24 DIAGNOSIS — Z96641 Presence of right artificial hip joint: Secondary | ICD-10-CM

## 2022-07-24 DIAGNOSIS — M1611 Unilateral primary osteoarthritis, right hip: Secondary | ICD-10-CM

## 2022-07-24 DIAGNOSIS — G4730 Sleep apnea, unspecified: Secondary | ICD-10-CM

## 2022-07-24 DIAGNOSIS — I10 Essential (primary) hypertension: Secondary | ICD-10-CM

## 2022-07-24 HISTORY — PX: TOTAL HIP ARTHROPLASTY: SHX124

## 2022-07-24 SURGERY — ARTHROPLASTY, HIP, TOTAL, ANTERIOR APPROACH
Anesthesia: Monitor Anesthesia Care | Site: Hip | Laterality: Right

## 2022-07-24 MED ORDER — CEFAZOLIN SODIUM-DEXTROSE 2-4 GM/100ML-% IV SOLN
INTRAVENOUS | Status: AC
  Filled 2022-07-24: qty 100

## 2022-07-24 MED ORDER — PRONTOSAN WOUND IRRIGATION OPTIME
TOPICAL | Status: DC | PRN
  Administered 2022-07-24: 350 mL

## 2022-07-24 MED ORDER — BUPIVACAINE-MELOXICAM ER 400-12 MG/14ML IJ SOLN
INTRAMUSCULAR | Status: DC | PRN
  Administered 2022-07-24: 400 mg

## 2022-07-24 MED ORDER — MIDAZOLAM HCL 2 MG/2ML IJ SOLN
INTRAMUSCULAR | Status: AC
  Filled 2022-07-24: qty 2

## 2022-07-24 MED ORDER — HYDRALAZINE HCL 20 MG/ML IJ SOLN
10.0000 mg | INTRAMUSCULAR | Status: DC | PRN
  Administered 2022-07-24: 10 mg via INTRAVENOUS
  Filled 2022-07-24 (×2): qty 0.5

## 2022-07-24 MED ORDER — PANTOPRAZOLE SODIUM 40 MG PO TBEC
40.0000 mg | DELAYED_RELEASE_TABLET | Freq: Every day | ORAL | Status: DC
  Administered 2022-07-24 – 2022-07-25 (×2): 40 mg via ORAL
  Filled 2022-07-24 (×2): qty 1

## 2022-07-24 MED ORDER — SORBITOL 70 % SOLN: 30.0000 mL | Freq: Every day | Status: DC | PRN

## 2022-07-24 MED ORDER — HYDRALAZINE HCL 20 MG/ML IJ SOLN
10.0000 mg | Freq: Once | INTRAMUSCULAR | Status: AC
  Administered 2022-07-24: 10 mg via INTRAVENOUS

## 2022-07-24 MED ORDER — PROPOFOL 500 MG/50ML IV EMUL
INTRAVENOUS | Status: DC | PRN
  Administered 2022-07-24: 75 ug/kg/min via INTRAVENOUS

## 2022-07-24 MED ORDER — OXYCODONE HCL 5 MG PO TABS
5.0000 mg | ORAL_TABLET | Freq: Once | ORAL | Status: AC | PRN
  Administered 2022-07-24: 5 mg via ORAL

## 2022-07-24 MED ORDER — DEXAMETHASONE SODIUM PHOSPHATE 10 MG/ML IJ SOLN
INTRAMUSCULAR | Status: DC | PRN
  Administered 2022-07-24: 10 mg via INTRAVENOUS

## 2022-07-24 MED ORDER — ORAL CARE MOUTH RINSE: 15.0000 mL | Freq: Once | OROMUCOSAL | Status: AC

## 2022-07-24 MED ORDER — HYDRALAZINE HCL 20 MG/ML IJ SOLN
5.0000 mg | INTRAMUSCULAR | Status: AC | PRN
  Administered 2022-07-24: 5 mg via INTRAVENOUS

## 2022-07-24 MED ORDER — METOPROLOL SUCCINATE ER 100 MG PO TB24
100.0000 mg | ORAL_TABLET | Freq: Every day | ORAL | Status: DC
  Administered 2022-07-25: 100 mg via ORAL
  Filled 2022-07-24: qty 1

## 2022-07-24 MED ORDER — VANCOMYCIN HCL 1 G IV SOLR
INTRAVENOUS | Status: DC | PRN
  Administered 2022-07-24: 1000 mg via TOPICAL

## 2022-07-24 MED ORDER — METHOCARBAMOL 1000 MG/10ML IJ SOLN: 500.0000 mg | Freq: Four times a day (QID) | INTRAVENOUS | Status: DC | PRN

## 2022-07-24 MED ORDER — CEFAZOLIN SODIUM-DEXTROSE 2-4 GM/100ML-% IV SOLN
2.0000 g | Freq: Four times a day (QID) | INTRAVENOUS | Status: AC
  Administered 2022-07-24 (×2): 2 g via INTRAVENOUS
  Filled 2022-07-24 (×2): qty 100

## 2022-07-24 MED ORDER — LACTATED RINGERS IV SOLN: INTRAVENOUS | Status: DC

## 2022-07-24 MED ORDER — ONDANSETRON HCL 4 MG PO TABS: 4.0000 mg | ORAL_TABLET | Freq: Four times a day (QID) | ORAL | Status: DC | PRN

## 2022-07-24 MED ORDER — CHLORHEXIDINE GLUCONATE 0.12 % MT SOLN
15.0000 mL | Freq: Once | OROMUCOSAL | Status: AC
  Administered 2022-07-24: 15 mL via OROMUCOSAL
  Filled 2022-07-24: qty 15

## 2022-07-24 MED ORDER — EPHEDRINE SULFATE-NACL 50-0.9 MG/10ML-% IV SOSY
PREFILLED_SYRINGE | INTRAVENOUS | Status: DC | PRN
  Administered 2022-07-24: 5 mg via INTRAVENOUS

## 2022-07-24 MED ORDER — PHENYLEPHRINE HCL-NACL 20-0.9 MG/250ML-% IV SOLN
INTRAVENOUS | Status: DC | PRN
  Administered 2022-07-24: 25 ug/min via INTRAVENOUS

## 2022-07-24 MED ORDER — 0.9 % SODIUM CHLORIDE (POUR BTL) OPTIME
TOPICAL | Status: DC | PRN
  Administered 2022-07-24: 1000 mL

## 2022-07-24 MED ORDER — TRANEXAMIC ACID 1000 MG/10ML IV SOLN
INTRAVENOUS | Status: DC | PRN
  Administered 2022-07-24: 2000 mg via TOPICAL

## 2022-07-24 MED ORDER — OXYCODONE HCL 5 MG/5ML PO SOLN: 5.0000 mg | Freq: Once | ORAL | Status: AC | PRN

## 2022-07-24 MED ORDER — TRANEXAMIC ACID-NACL 1000-0.7 MG/100ML-% IV SOLN
1000.0000 mg | Freq: Once | INTRAVENOUS | Status: AC
  Administered 2022-07-24: 1000 mg via INTRAVENOUS
  Filled 2022-07-24: qty 100

## 2022-07-24 MED ORDER — HYDRALAZINE HCL 20 MG/ML IJ SOLN
INTRAMUSCULAR | Status: AC
  Administered 2022-07-24: 5 mg via INTRAVENOUS
  Filled 2022-07-24: qty 1

## 2022-07-24 MED ORDER — VANCOMYCIN HCL 1000 MG IV SOLR
INTRAVENOUS | Status: AC
  Filled 2022-07-24: qty 20

## 2022-07-24 MED ORDER — METHOCARBAMOL 500 MG PO TABS
500.0000 mg | ORAL_TABLET | Freq: Four times a day (QID) | ORAL | Status: DC | PRN
  Administered 2022-07-24 – 2022-07-25 (×2): 500 mg via ORAL
  Filled 2022-07-24 (×3): qty 1

## 2022-07-24 MED ORDER — MIDAZOLAM HCL 2 MG/2ML IJ SOLN
INTRAMUSCULAR | Status: DC | PRN
  Administered 2022-07-24: 2 mg via INTRAVENOUS

## 2022-07-24 MED ORDER — EPHEDRINE 5 MG/ML INJ
INTRAVENOUS | Status: AC
  Filled 2022-07-24: qty 5

## 2022-07-24 MED ORDER — ONDANSETRON HCL 4 MG/2ML IJ SOLN
INTRAMUSCULAR | Status: AC
  Filled 2022-07-24: qty 6

## 2022-07-24 MED ORDER — OXYCODONE HCL ER 10 MG PO T12A
10.0000 mg | EXTENDED_RELEASE_TABLET | Freq: Two times a day (BID) | ORAL | Status: DC
  Administered 2022-07-24 – 2022-07-25 (×3): 10 mg via ORAL
  Filled 2022-07-24 (×3): qty 1

## 2022-07-24 MED ORDER — ACETAMINOPHEN 325 MG PO TABS: 325.0000 mg | ORAL_TABLET | Freq: Four times a day (QID) | ORAL | Status: DC | PRN

## 2022-07-24 MED ORDER — PHENOL 1.4 % MT LIQD: 1.0000 | OROMUCOSAL | Status: DC | PRN

## 2022-07-24 MED ORDER — MENTHOL 3 MG MT LOZG: 1.0000 | LOZENGE | OROMUCOSAL | Status: DC | PRN

## 2022-07-24 MED ORDER — METOCLOPRAMIDE HCL 5 MG PO TABS: 5.0000 mg | ORAL_TABLET | Freq: Three times a day (TID) | ORAL | Status: DC | PRN

## 2022-07-24 MED ORDER — ONDANSETRON HCL 4 MG/2ML IJ SOLN
4.0000 mg | Freq: Four times a day (QID) | INTRAMUSCULAR | Status: DC | PRN
  Administered 2022-07-24 (×2): 4 mg via INTRAVENOUS
  Filled 2022-07-24 (×2): qty 2

## 2022-07-24 MED ORDER — METOCLOPRAMIDE HCL 5 MG/ML IJ SOLN
5.0000 mg | Freq: Three times a day (TID) | INTRAMUSCULAR | Status: DC | PRN
  Administered 2022-07-24: 10 mg via INTRAVENOUS
  Filled 2022-07-24: qty 2

## 2022-07-24 MED ORDER — ACETAMINOPHEN 500 MG PO TABS
1000.0000 mg | ORAL_TABLET | Freq: Once | ORAL | Status: AC
  Administered 2022-07-24: 1000 mg via ORAL
  Filled 2022-07-24: qty 2

## 2022-07-24 MED ORDER — DEXAMETHASONE SODIUM PHOSPHATE 10 MG/ML IJ SOLN: 10.0000 mg | Freq: Once | INTRAMUSCULAR | Status: DC

## 2022-07-24 MED ORDER — DOCUSATE SODIUM 100 MG PO CAPS
100.0000 mg | ORAL_CAPSULE | Freq: Two times a day (BID) | ORAL | Status: DC
  Administered 2022-07-24 – 2022-07-25 (×2): 100 mg via ORAL
  Filled 2022-07-24 (×2): qty 1

## 2022-07-24 MED ORDER — ASPIRIN 81 MG PO CHEW
81.0000 mg | CHEWABLE_TABLET | Freq: Two times a day (BID) | ORAL | Status: DC
  Administered 2022-07-24 – 2022-07-25 (×2): 81 mg via ORAL
  Filled 2022-07-24 (×2): qty 1

## 2022-07-24 MED ORDER — SODIUM CHLORIDE 0.9 % IR SOLN
Status: DC | PRN
  Administered 2022-07-24: 1000 mL

## 2022-07-24 MED ORDER — STERILE WATER FOR IRRIGATION IR SOLN
Status: DC | PRN
  Administered 2022-07-24: 1000 mL

## 2022-07-24 MED ORDER — OXYCODONE HCL 5 MG PO TABS
ORAL_TABLET | ORAL | Status: AC
  Filled 2022-07-24: qty 1

## 2022-07-24 MED ORDER — HYDROMORPHONE HCL 1 MG/ML IJ SOLN
0.5000 mg | INTRAMUSCULAR | Status: DC | PRN
  Administered 2022-07-24: 0.5 mg via INTRAVENOUS
  Filled 2022-07-24: qty 1

## 2022-07-24 MED ORDER — TRANEXAMIC ACID-NACL 1000-0.7 MG/100ML-% IV SOLN
1000.0000 mg | INTRAVENOUS | Status: AC
  Administered 2022-07-24: 1000 mg via INTRAVENOUS
  Filled 2022-07-24: qty 100

## 2022-07-24 MED ORDER — DIPHENHYDRAMINE HCL 12.5 MG/5ML PO ELIX
25.0000 mg | ORAL_SOLUTION | ORAL | Status: DC | PRN
  Administered 2022-07-24 (×2): 25 mg via ORAL
  Filled 2022-07-24 (×2): qty 10

## 2022-07-24 MED ORDER — DEXAMETHASONE SODIUM PHOSPHATE 10 MG/ML IJ SOLN
INTRAMUSCULAR | Status: AC
  Filled 2022-07-24: qty 3

## 2022-07-24 MED ORDER — DEXAMETHASONE SODIUM PHOSPHATE 10 MG/ML IJ SOLN
INTRAMUSCULAR | Status: AC
  Filled 2022-07-24: qty 1

## 2022-07-24 MED ORDER — PROPOFOL 10 MG/ML IV BOLUS
INTRAVENOUS | Status: AC
  Filled 2022-07-24: qty 20

## 2022-07-24 MED ORDER — HYDROXYZINE HCL 50 MG/ML IM SOLN
50.0000 mg | Freq: Four times a day (QID) | INTRAMUSCULAR | Status: AC | PRN
  Administered 2022-07-24: 50 mg via INTRAMUSCULAR
  Filled 2022-07-24: qty 1

## 2022-07-24 MED ORDER — ACETAMINOPHEN 500 MG PO TABS
1000.0000 mg | ORAL_TABLET | Freq: Four times a day (QID) | ORAL | Status: DC
  Administered 2022-07-24 – 2022-07-25 (×3): 1000 mg via ORAL
  Filled 2022-07-24 (×4): qty 2

## 2022-07-24 MED ORDER — ONDANSETRON HCL 4 MG/2ML IJ SOLN
INTRAMUSCULAR | Status: AC
  Filled 2022-07-24: qty 2

## 2022-07-24 MED ORDER — AMISULPRIDE (ANTIEMETIC) 5 MG/2ML IV SOLN: 10.0000 mg | Freq: Once | INTRAVENOUS | Status: DC | PRN

## 2022-07-24 MED ORDER — FERROUS SULFATE 325 (65 FE) MG PO TABS
325.0000 mg | ORAL_TABLET | Freq: Three times a day (TID) | ORAL | Status: DC
  Administered 2022-07-24 – 2022-07-25 (×2): 325 mg via ORAL
  Filled 2022-07-24 (×3): qty 1

## 2022-07-24 MED ORDER — CEFAZOLIN SODIUM-DEXTROSE 2-4 GM/100ML-% IV SOLN
2.0000 g | INTRAVENOUS | Status: AC
  Administered 2022-07-24: 2 g via INTRAVENOUS

## 2022-07-24 MED ORDER — METHOCARBAMOL 500 MG PO TABS
ORAL_TABLET | ORAL | Status: AC
  Administered 2022-07-24: 500 mg
  Filled 2022-07-24: qty 1

## 2022-07-24 MED ORDER — PROPOFOL 10 MG/ML IV BOLUS
INTRAVENOUS | Status: DC | PRN
  Administered 2022-07-24: 30 mg via INTRAVENOUS

## 2022-07-24 MED ORDER — ONDANSETRON HCL 4 MG/2ML IJ SOLN
INTRAMUSCULAR | Status: DC | PRN
  Administered 2022-07-24: 4 mg via INTRAVENOUS

## 2022-07-24 MED ORDER — FENTANYL CITRATE (PF) 250 MCG/5ML IJ SOLN
INTRAMUSCULAR | Status: AC
  Filled 2022-07-24: qty 5

## 2022-07-24 MED ORDER — OXYCODONE HCL 5 MG PO TABS
10.0000 mg | ORAL_TABLET | ORAL | Status: DC | PRN
  Administered 2022-07-24 – 2022-07-25 (×2): 10 mg via ORAL

## 2022-07-24 MED ORDER — POVIDONE-IODINE 10 % EX SWAB
2.0000 | Freq: Once | CUTANEOUS | Status: AC
  Administered 2022-07-24: 2 via TOPICAL

## 2022-07-24 MED ORDER — OXYCODONE HCL 5 MG PO TABS
5.0000 mg | ORAL_TABLET | ORAL | Status: DC | PRN
  Filled 2022-07-24 (×2): qty 1
  Filled 2022-07-24: qty 2

## 2022-07-24 MED ORDER — SODIUM CHLORIDE 0.9 % IV SOLN: INTRAVENOUS | Status: DC

## 2022-07-24 MED ORDER — FENTANYL CITRATE (PF) 250 MCG/5ML IJ SOLN
INTRAMUSCULAR | Status: DC | PRN
  Administered 2022-07-24: 50 ug via INTRAVENOUS

## 2022-07-24 MED ORDER — POLYETHYLENE GLYCOL 3350 17 G PO PACK
17.0000 g | PACK | Freq: Every day | ORAL | Status: DC
  Administered 2022-07-24 – 2022-07-25 (×2): 17 g via ORAL
  Filled 2022-07-24 (×2): qty 1

## 2022-07-24 MED ORDER — BUPIVACAINE IN DEXTROSE 0.75-8.25 % IT SOLN
INTRATHECAL | Status: DC | PRN
  Administered 2022-07-24: 1.8 mL via INTRATHECAL

## 2022-07-24 MED ORDER — FENTANYL CITRATE (PF) 100 MCG/2ML IJ SOLN: 25.0000 ug | INTRAMUSCULAR | Status: DC | PRN

## 2022-07-24 MED ORDER — MAGNESIUM CITRATE PO SOLN: 1.0000 | Freq: Once | ORAL | Status: DC | PRN

## 2022-07-24 MED ORDER — BUPIVACAINE-MELOXICAM ER 400-12 MG/14ML IJ SOLN
INTRAMUSCULAR | Status: AC
  Filled 2022-07-24: qty 1

## 2022-07-24 MED ORDER — ALUM & MAG HYDROXIDE-SIMETH 200-200-20 MG/5ML PO SUSP: 30.0000 mL | ORAL | Status: DC | PRN

## 2022-07-24 SURGICAL SUPPLY — 70 items
ADH SKN CLS APL DERMABOND .7 (GAUZE/BANDAGES/DRESSINGS) ×1
BAG COUNTER SPONGE SURGICOUNT (BAG) ×1 IMPLANT
BAG DECANTER FOR FLEXI CONT (MISCELLANEOUS) ×1 IMPLANT
BAG SPNG CNTER NS LX DISP (BAG) ×1
BLADE SAG 18X100X1.27 (BLADE) ×1 IMPLANT
CELLS DAT CNTRL 66122 CELL SVR (MISCELLANEOUS) ×1 IMPLANT
COOLER ICEMAN CLASSIC (MISCELLANEOUS) IMPLANT
COVER PERINEAL POST (MISCELLANEOUS) ×1 IMPLANT
COVER SURGICAL LIGHT HANDLE (MISCELLANEOUS) ×1 IMPLANT
DERMABOND ADVANCED .7 DNX12 (GAUZE/BANDAGES/DRESSINGS) IMPLANT
DRAPE C-ARM 42X72 X-RAY (DRAPES) ×1 IMPLANT
DRAPE POUCH INSTRU U-SHP 10X18 (DRAPES) ×1 IMPLANT
DRAPE STERI IOBAN 125X83 (DRAPES) ×1 IMPLANT
DRAPE U-SHAPE 47X51 STRL (DRAPES) ×2 IMPLANT
DRSG AQUACEL AG ADV 3.5X10 (GAUZE/BANDAGES/DRESSINGS) ×1 IMPLANT
DURAPREP 26ML APPLICATOR (WOUND CARE) ×2 IMPLANT
ELECT BLADE 4.0 EZ CLEAN MEGAD (MISCELLANEOUS) ×1
ELECT REM PT RETURN 9FT ADLT (ELECTROSURGICAL) ×1
ELECTRODE BLDE 4.0 EZ CLN MEGD (MISCELLANEOUS) ×1 IMPLANT
ELECTRODE REM PT RTRN 9FT ADLT (ELECTROSURGICAL) ×1 IMPLANT
GLOVE BIOGEL PI IND STRL 7.0 (GLOVE) ×2 IMPLANT
GLOVE BIOGEL PI IND STRL 7.5 (GLOVE) ×5 IMPLANT
GLOVE ECLIPSE 7.0 STRL STRAW (GLOVE) ×2 IMPLANT
GLOVE SKINSENSE STRL SZ7.5 (GLOVE) ×1 IMPLANT
GLOVE SURG SYN 7.5  E (GLOVE) ×2
GLOVE SURG SYN 7.5 E (GLOVE) ×2 IMPLANT
GLOVE SURG SYN 7.5 PF PI (GLOVE) ×2 IMPLANT
GLOVE SURG UNDER POLY LF SZ7 (GLOVE) ×3 IMPLANT
GLOVE SURG UNDER POLY LF SZ7.5 (GLOVE) ×2 IMPLANT
GOWN STRL REUS W/ TWL LRG LVL3 (GOWN DISPOSABLE) IMPLANT
GOWN STRL REUS W/ TWL XL LVL3 (GOWN DISPOSABLE) ×1 IMPLANT
GOWN STRL REUS W/TWL LRG LVL3 (GOWN DISPOSABLE)
GOWN STRL REUS W/TWL XL LVL3 (GOWN DISPOSABLE) ×1
GOWN STRL SURGICAL XL XLNG (GOWN DISPOSABLE) ×1 IMPLANT
GOWN TOGA ZIPPER T7+ PEEL AWAY (MISCELLANEOUS) ×2 IMPLANT
HANDPIECE INTERPULSE COAX TIP (DISPOSABLE) ×1
HEAD CERAMIC DELTA 36 PLUS 1.5 (Hips) IMPLANT
HOOD PEEL AWAY T7 (MISCELLANEOUS) ×1 IMPLANT
IV NS IRRIG 3000ML ARTHROMATIC (IV SOLUTION) ×1 IMPLANT
KIT BASIN OR (CUSTOM PROCEDURE TRAY) ×1 IMPLANT
LINER NEUTRAL 52X36MM PLUS 4 (Liner) IMPLANT
MARKER SKIN DUAL TIP RULER LAB (MISCELLANEOUS) ×1 IMPLANT
NDL SPNL 18GX3.5 QUINCKE PK (NEEDLE) ×1 IMPLANT
NEEDLE SPNL 18GX3.5 QUINCKE PK (NEEDLE) ×1 IMPLANT
PACK TOTAL JOINT (CUSTOM PROCEDURE TRAY) ×1 IMPLANT
PACK UNIVERSAL I (CUSTOM PROCEDURE TRAY) ×1 IMPLANT
PAD COLD SHLDR WRAP-ON (PAD) IMPLANT
PIN SECTOR W/GRIP ACE CUP 52MM (Hips) IMPLANT
RETRACTOR WND ALEXIS 18 MED (MISCELLANEOUS) IMPLANT
RTRCTR WOUND ALEXIS 18CM MED (MISCELLANEOUS) ×1
SET HNDPC FAN SPRY TIP SCT (DISPOSABLE) ×1 IMPLANT
SOLUTION PRONTOSAN WOUND 350ML (IRRIGATION / IRRIGATOR) ×1 IMPLANT
STAPLER VISISTAT 35W (STAPLE) IMPLANT
STEM FEM SZ3 STD ACTIS (Stem) IMPLANT
SUT ETHIBOND 2 V 37 (SUTURE) ×1 IMPLANT
SUT ETHILON 2 0 FS 18 (SUTURE) IMPLANT
SUT VIC AB 0 CT1 27 (SUTURE) ×1
SUT VIC AB 0 CT1 27XBRD ANBCTR (SUTURE) ×1 IMPLANT
SUT VIC AB 1 CTX 36 (SUTURE) ×1
SUT VIC AB 1 CTX36XBRD ANBCTR (SUTURE) ×1 IMPLANT
SUT VIC AB 2-0 CT1 27 (SUTURE) ×3
SUT VIC AB 2-0 CT1 TAPERPNT 27 (SUTURE) ×2 IMPLANT
SYR 50ML LL SCALE MARK (SYRINGE) ×1 IMPLANT
TOWEL GREEN STERILE (TOWEL DISPOSABLE) ×1 IMPLANT
TRAY CATH INTERMITTENT SS 16FR (CATHETERS) IMPLANT
TRAY FOLEY W/BAG SLVR 16FR (SET/KITS/TRAYS/PACK) ×1
TRAY FOLEY W/BAG SLVR 16FR ST (SET/KITS/TRAYS/PACK) IMPLANT
TUBE SUCT ARGYLE STRL (TUBING) ×1 IMPLANT
WATER STERILE IRR 1000ML POUR (IV SOLUTION) IMPLANT
YANKAUER SUCT BULB TIP NO VENT (SUCTIONS) ×1 IMPLANT

## 2022-07-24 NOTE — Plan of Care (Signed)

## 2022-07-24 NOTE — Discharge Instructions (Signed)

## 2022-07-24 NOTE — Transfer of Care (Signed)
Immediate Anesthesia Transfer of Care Note  Patient: Veronica Pollard  Procedure(s) Performed: RIGHT TOTAL HIP ARTHROPLASTY ANTERIOR APPROACH (Right: Hip)  Patient Location: PACU  Anesthesia Type:MAC and Spinal  Level of Consciousness: awake, alert , oriented, and patient cooperative  Airway & Oxygen Therapy: Patient Spontanous Breathing  Post-op Assessment: Report given to RN and Post -op Vital signs reviewed and stable  Post vital signs: Reviewed and stable  Last Vitals:  Vitals Value Taken Time  BP 136/98 07/24/22 0951  Temp    Pulse 43 07/24/22 0955  Resp 16 07/24/22 0955  SpO2 96 % 07/24/22 0955  Vitals shown include unvalidated device data.  Last Pain:  Vitals:   07/24/22 0616  TempSrc:   PainSc: 4       Patients Stated Pain Goal: 0 (07/24/22 0616)  Complications: No notable events documented.

## 2022-07-24 NOTE — Progress Notes (Signed)
   07/24/22 1945  Assess: MEWS Score  Temp 98.2 F (36.8 C)  BP (!) 223/99  MAP (mmHg) 135  Pulse Rate (!) 55  Resp 20  Level of Consciousness Alert  SpO2 100 %  O2 Device Room Air  Assess: MEWS Score  MEWS Temp 0  MEWS Systolic 2  MEWS Pulse 0  MEWS RR 0  MEWS LOC 0  MEWS Score 2  MEWS Score Color Yellow  Assess: if the MEWS score is Yellow or Red  Were vital signs taken at a resting state? Yes  Focused Assessment No change from prior assessment  Does the patient meet 2 or more of the SIRS criteria? No  MEWS guidelines implemented  Yes, yellow  Treat  MEWS Interventions Considered administering scheduled or prn medications/treatments as ordered  Take Vital Signs  Increase Vital Sign Frequency  Yellow: Q2hr x1, continue Q4hrs until patient remains green for 12hrs  Escalate  MEWS: Escalate Yellow: Discuss with charge nurse and consider notifying provider and/or RRT  Notify: Charge Nurse/RN  Name of Charge Nurse/RN Notified Peterson Lombard, RN  Provider Notification  Provider Name/Title Dr. Lajoyce Corners  Date Provider Notified 07/24/22  Time Provider Notified 1950  Method of Notification Call  Notification Reason Critical Result (bp high)  Provider response See new orders  Assess: SIRS CRITERIA  SIRS Temperature  0  SIRS Pulse 0  SIRS Respirations  0  SIRS WBC 0  SIRS Score Sum  0

## 2022-07-24 NOTE — Op Note (Signed)
RIGHT TOTAL HIP ARTHROPLASTY ANTERIOR APPROACH  Procedure Note Veronica Pollard   [id number]  Pre-op Diagnosis: right hip osteoarthritis     Post-op Diagnosis: same  Operative Findings Complete loss of joint space Joint effusion, synovitis Leg length discrepancy   Operative Procedures  1. Total hip replacement; Right hip; uncemented [id number]   Surgeon: Gershon Mussel, M.D.  Assist: Oneal Grout, PA-C   Anesthesia: spinal  Prosthesis: Depuy Acetabulum: Pinnacle 52 mm Femur: Actis 3 STD Head: 36 mm size: +1.5 Liner: +4 Bearing Type: ceramic/poly  Total Hip Arthroplasty (Anterior Approach) Op Note:  After informed consent was obtained and the operative extremity marked in the holding area, the patient was brought back to the operating room and placed supine on the HANA table. Next, the operative extremity was prepped and draped in normal sterile fashion. Surgical timeout occurred verifying patient identification, surgical site, surgical procedure and administration of antibiotics.  A Hueter approach to the hip was performed, using the interval between tensor fascia lata and sartorius.  Dissection was carried bluntly down onto the anterior hip capsule. The lateral femoral circumflex vessels were identified and coagulated. A capsulotomy was performed and the capsular flaps tagged for later repair.  The neck osteotomy was performed. The femoral head was removed which showed complete loss of articular cartilage, the acetabular rim was cleared of soft tissue and osteophytes and attention was turned to reaming the acetabulum.  Sequential reaming was performed under fluoroscopic guidance down to the floor of the cotyloid fossa. We reamed to a size 51 mm, and then impacted the acetabular shell.   The liner was then placed after irrigation and attention turned to the femur.  After placing the femoral hook, the leg was taken to externally rotated, extended and adducted position taking care  to perform soft tissue releases to allow for adequate mobilization of the femur. Soft tissue was cleared from the shoulder of the greater trochanter and the hook elevator used to improve exposure of the proximal femur. Sequential broaching performed up to a size 3. Trial neck and head were placed. The leg was brought back up to neutral and the construct reduced.  Antibiotic irrigation was placed in the surgical wound.  The position and sizing of components, offset and leg lengths were checked using fluoroscopy. Stability of the construct was checked in extension and external rotation without any subluxation, shuck or impingement of prosthesis. We dislocated the prosthesis, dropped the leg back into position, removed trial components, and irrigated copiously. The final stem and head was then placed, the leg brought back up, the system reduced and fluoroscopy used to verify positioning.  We irrigated, obtained hemostasis and closed the capsule using #2 ethibond suture.  One gram of vancomycin powder was placed in the surgical bed.   One gram of topical tranexamic acid was injected into the joint.  The fascia was closed with #1 vicryl plus, the deep fat layer was closed with 0 vicryl, the subcutaneous layers closed with 2.0 Vicryl Plus and the skin closed with 2.0 nylon and dermabond. A sterile dressing was applied. The patient was awakened in the operating room and taken to recovery in stable condition.  All sponge, needle, and instrument counts were correct at the end of the case.   Tessa Lerner, my PA, was a medical necessity for opening, closing, limb positioning, retracting, exposing, and overall facilitation and timely completion of the surgery.  Position: supine  Complications: see description of procedure.  Time Out: performed   Drains/Packing:  none  Estimated blood loss: see anesthesia record  Returned to Recovery Room: in good condition.   Antibiotics: yes   Mechanical VTE (DVT)  Prophylaxis: sequential compression devices, TED thigh-high  Chemical VTE (DVT) Prophylaxis: aspirin   Fluid Replacement: see anesthesia record  Specimens Removed: 1 to pathology   Sponge and Instrument Count Correct? yes   PACU: portable radiograph - low AP   Plan/RTC: Return in 2 weeks for staple removal. Weight Bearing/Load Lower Extremity: full  Hip precautions: none Suture Removal: 2 weeks   N. Glee Arvin, MD [facility] 9:09 AM   Implant Name Type Inv. Item Serial No. Manufacturer Lot No. LRB No. Used Action  PIN SECTOR W/GRIP ACE CUP - [id number] Hips PIN SECTOR W/GRIP ACE CUP  [employer] [id number] Right 1 Implanted  LINER NEUTRAL 52X36MM PLUS 4 - [id number] Liner LINER NEUTRAL [id number] PLUS 4  [employer] [id number] Right 1 Implanted  STEM FEM SZ3 STD ACTIS - [id number] Stem STEM FEM SZ3 STD ACTIS  [employer] [id number] Right 1 Implanted  HEAD CERAMIC DELTA 36 PLUS 1.5 - [id number] Hips HEAD CERAMIC DELTA 36 PLUS 1.5  [employer] [id number] Right 1 Implanted

## 2022-07-24 NOTE — Progress Notes (Signed)
Patient c/o hearing pop  and pain on her hip while trying to get in bed. MD notified, 1 view hip xray done per order, also patient c/o tingling/swelling to her tongue while infusing Ancef, benadryl given per order. MD aware. Will continue to monitor the patient.

## 2022-07-24 NOTE — Progress Notes (Signed)
Allergies and reactions reviewed with Dr. Roda Shutters. Rip Harbour to give Ancef per Dr. Roda Shutters.

## 2022-07-24 NOTE — Anesthesia Procedure Notes (Signed)
Spinal  Patient location during procedure: OR Start time: 07/24/2022 7:42 AM End time: 07/24/2022 7:44 AM Reason for block: surgical anesthesia Staffing Performed: anesthesiologist  Anesthesiologist: Linton Rump, MD Performed by: Linton Rump, MD Authorized by: Linton Rump, MD   Preanesthetic Checklist Completed: patient identified, IV checked, site marked, risks and benefits discussed, surgical consent, monitors and equipment checked, pre-op evaluation and timeout performed Spinal Block Patient position: sitting Prep: DuraPrep Patient monitoring: blood pressure and continuous pulse ox Approach: midline Location: L3-4 Injection technique: single-shot Needle Needle type: Pencan  Needle gauge: 24 G Needle length: 9 cm Additional Notes Risks and benefits of neuraxial anesthesia including, but not limited to, infection, bleeding, local anesthetic toxicity, headache, hypotension, back pain, block failure, etc. were discussed with the patient. The patient expressed understanding and consented to the procedure. I confirmed that the patient has no bleeding disorders and is not taking blood thinners. I confirmed the patient's last platelet count with the nurse. Monitors were applied. A time-out was performed immediately prior to the procedure. Sterile technique was used throughout the whole procedure.   1 attempt(s)

## 2022-07-24 NOTE — Progress Notes (Signed)
Paged oncall for PRN blood pressure med

## 2022-07-24 NOTE — Evaluation (Signed)
Physical Therapy Evaluation Patient Details Name: Veronica Pollard MRN: [id number] DOB: 1970-11-09 Today's Date: 07/24/2022  History of Present Illness  52 y.o. female presents to [facility] on 07/24/2022 for elective R THA. PMH includes HTN, IBS, OSA, OA.  Clinical Impression  Pt presents to PT mobilizing very well s/p R THA. Pt is able to ambulate for household distances with RW. Pt is able to transfer without support of DME, although PT encourages use of RW at this time to improve stability and reduce potential falls risk. PT will follow up tomorrow for stair training and continued mobility progression.       Recommendations for follow up therapy are one component of a multi-disciplinary discharge planning process, led by the attending physician.  Recommendations may be updated based on patient status, additional functional criteria and insurance authorization.  Follow Up Recommendations       Assistance Recommended at Discharge Intermittent Supervision/Assistance  Patient can return home with the following  A little help with bathing/dressing/bathroom;Assistance with cooking/housework;Assist for transportation;Help with stairs or ramp for entrance    Equipment Recommendations Rolling walker (2 wheels)  Recommendations for Other Services       Functional Status Assessment Patient has had a recent decline in their functional status and demonstrates the ability to make significant improvements in function in a reasonable and predictable amount of time.     Precautions / Restrictions Precautions Precautions: Fall Restrictions Weight Bearing Restrictions: Yes RLE Weight Bearing: Weight bearing as tolerated      Mobility  Bed Mobility Overal bed mobility: Needs Assistance Bed Mobility: Supine to Sit     Supine to sit: Supervision, HOB elevated          Transfers Overall transfer level: Modified independent Equipment used: Rolling walker (2 wheels)                     Ambulation/Gait Ambulation/Gait assistance: Modified independent (Device/Increase time) Gait Distance (Feet): 150 Feet Assistive device: Rolling walker (2 wheels) Gait Pattern/deviations: Step-through pattern Gait velocity: reduced stride length, mild increase in trunk flexion Gait velocity interpretation: 1.31 - 2.62 ft/sec, indicative of limited community ambulator   General Gait Details: slowed step-through gait  Stairs            Wheelchair Mobility    Modified Rankin (Stroke Patients Only)       Balance Overall balance assessment: Needs assistance Sitting-balance support: No upper extremity supported, Feet supported Sitting balance-Leahy Scale: Good     Standing balance support: Single extremity supported, Reliant on assistive device for balance Standing balance-Leahy Scale: Poor                               Pertinent Vitals/Pain Pain Assessment Pain Assessment: No/denies pain    Home Living Family/patient expects to be discharged to:: Private residence Living Arrangements: Spouse/significant other Available Help at Discharge: Family;Available PRN/intermittently Type of Home: House Home Access: Stairs to enter Entrance Stairs-Rails: Can reach both Entrance Stairs-Number of Steps: 4 Alternate Level Stairs-Number of Steps: 15 Home Layout: Two level Home Equipment: Cane - single point;Toilet riser      Prior Function Prior Level of Function : Independent/Modified Independent;Driving                     Hand Dominance        Extremity/Trunk Assessment   Upper Extremity Assessment Upper Extremity Assessment: Overall WFL for tasks assessed  Lower Extremity Assessment Lower Extremity Assessment: RLE deficits/detail RLE Deficits / Details: generalized weakness, 4-/5 hip strength    Cervical / Trunk Assessment Cervical / Trunk Assessment: Normal  Communication   Communication: No difficulties  Cognition  Arousal/Alertness: Awake/alert Behavior During Therapy: WFL for tasks assessed/performed Overall Cognitive Status: Within Functional Limits for tasks assessed                                          General Comments General comments (skin integrity, edema, etc.): VSS on RA    Exercises     Assessment/Plan    PT Assessment Patient needs continued PT services  PT Problem List Decreased strength;Decreased balance;Decreased activity tolerance;Decreased mobility;Decreased knowledge of use of DME;Pain       PT Treatment Interventions DME instruction;Gait training;Stair training;Functional mobility training;Therapeutic activities;Therapeutic exercise;Balance training;Neuromuscular re-education;Patient/family education    PT Goals (Current goals can be found in the Care Plan section)  Acute Rehab PT Goals Patient Stated Goal: to return to independence PT Goal Formulation: With patient Time For Goal Achievement: 07/28/22 Potential to Achieve Goals: Good    Frequency 7X/week     Co-evaluation               AM-PAC PT "6 Clicks" Mobility  Outcome Measure Help needed turning from your back to your side while in a flat bed without using bedrails?: A Little Help needed moving from lying on your back to sitting on the side of a flat bed without using bedrails?: A Little Help needed moving to and from a bed to a chair (including a wheelchair)?: None Help needed standing up from a chair using your arms (e.g., wheelchair or bedside chair)?: None Help needed to walk in hospital room?: None Help needed climbing 3-5 steps with a railing? : A Little 6 Click Score: 21    End of Session   Activity Tolerance: Patient tolerated treatment well Patient left: in chair;with call bell/phone within reach Nurse Communication: Mobility status PT Visit Diagnosis: Other abnormalities of gait and mobility (R26.89);Muscle weakness (generalized) (M62.81)    Time: 1610-9604 PT  Time Calculation (min) (ACUTE ONLY): 19 min   Charges:   PT Evaluation $PT Eval Low Complexity: 1 Low          Arlyss Gandy, PT, DPT Acute Rehabilitation Office [phone]   Arlyss Gandy 07/24/2022, 2:05 PM

## 2022-07-24 NOTE — H&P (Signed)

## 2022-07-24 NOTE — Anesthesia Procedure Notes (Signed)
Procedure Name: MAC Date/Time: 07/24/2022 7:47 AM  Performed by: Garfield Cornea, CRNAPre-anesthesia Checklist: Patient identified, Emergency Drugs available, Suction available and Patient being monitored Patient Re-evaluated:Patient Re-evaluated prior to induction Oxygen Delivery Method: Simple face mask Dental Injury: Teeth and Oropharynx as per pre-operative assessment

## 2022-07-24 NOTE — Anesthesia Postprocedure Evaluation (Signed)
Anesthesia Post Note  Patient: Nealy Holgerson  Procedure(s) Performed: RIGHT TOTAL HIP ARTHROPLASTY ANTERIOR APPROACH (Right: Hip)     Patient location during evaluation: PACU Anesthesia Type: MAC and Spinal Level of consciousness: awake Pain management: pain level controlled Vital Signs Assessment: post-procedure vital signs reviewed and stable Respiratory status: spontaneous breathing, respiratory function stable and nonlabored ventilation Cardiovascular status: blood pressure returned to baseline and stable Postop Assessment: no headache, no backache and no apparent nausea or vomiting Anesthetic complications: no   No notable events documented.  Last Vitals:  Vitals:   07/24/22 1100 07/24/22 1115  BP: (!) 206/105 (!) 194/101  Pulse: (!) 47 (!) 46  Resp: (!) 25 17  Temp:    SpO2: 94% 96%    Last Pain:  Vitals:   07/24/22 1100  TempSrc:   PainSc: 0-No pain                 Linton Rump

## 2022-07-25 ENCOUNTER — Telehealth: Payer: Self-pay

## 2022-07-25 ENCOUNTER — Encounter (HOSPITAL_COMMUNITY): Payer: Self-pay | Admitting: Orthopaedic Surgery

## 2022-07-25 DIAGNOSIS — M1611 Unilateral primary osteoarthritis, right hip: Secondary | ICD-10-CM | POA: Diagnosis not present

## 2022-07-25 LAB — CBC
HCT: 32.1 % — ABNORMAL LOW (ref 36.0–46.0)
Hemoglobin: 10 g/dL — ABNORMAL LOW (ref 12.0–15.0)
MCH: 22.6 pg — ABNORMAL LOW (ref 26.0–34.0)
MCHC: 31.2 g/dL (ref 30.0–36.0)
MCV: 72.6 fL — ABNORMAL LOW (ref 80.0–100.0)
Platelets: 318 10*3/uL (ref 150–400)
RBC: 4.42 MIL/uL (ref 3.87–5.11)
RDW: 16.4 % — ABNORMAL HIGH (ref 11.5–15.5)
WBC: 19.6 10*3/uL — ABNORMAL HIGH (ref 4.0–10.5)
nRBC: 0 % (ref 0.0–0.2)

## 2022-07-25 NOTE — Progress Notes (Signed)
Physical Therapy Treatment and Discharge  Patient Details Name: Veronica Pollard MRN: [id number] DOB: [birth year] Today's Date: 07/25/2022   History of Present Illness Pt is a 52 y/o female presents to [facility] hospital on 07/24/2022 for elective R THA. PMH includes HTN, IBS, OSA, OA.    PT Comments    Pt progressing towards physical therapy goals. Was able to perform transfers and ambulation with gross modified independence and RW for support. Pt able to transfer to standing and take a few steps around the room without AD. Reviewed HEP. Pt has met acute PT goals and we will sign off at this time. If needs change, please reconsult.    Recommendations for follow up therapy are one component of a multi-disciplinary discharge planning process, led by the attending physician.  Recommendations may be updated based on patient status, additional functional criteria and insurance authorization.  Follow Up Recommendations       Assistance Recommended at Discharge Intermittent Supervision/Assistance  Patient can return home with the following A little help with bathing/dressing/bathroom;Assistance with cooking/housework;Assist for transportation;Help with stairs or ramp for entrance   Equipment Recommendations  Rolling walker (2 wheels)    Recommendations for Other Services       Precautions / Restrictions Precautions Precautions: Fall Restrictions Weight Bearing Restrictions: Yes RLE Weight Bearing: Weight bearing as tolerated     Mobility  Bed Mobility Overal bed mobility: Modified Independent Bed Mobility: Supine to Sit           General bed mobility comments: Increased time but no assist    Transfers Overall transfer level: Independent Equipment used: Rolling walker (2 wheels), None                    Ambulation/Gait Ambulation/Gait assistance: Modified independent (Device/Increase time) Gait Distance (Feet): 400 Feet Assistive device: Rolling walker (2 wheels) Gait  Pattern/deviations: Step-through pattern, Decreased stride length, Narrow base of support Gait velocity: Decreased Gait velocity interpretation: 1.31 - 2.62 ft/sec, indicative of limited community ambulator   General Gait Details: With cues, able to progress to mod I level. Good step-through pattern and heel strike by end of gait training.   Stairs Stairs: Yes Stairs assistance: Modified independent (Device/Increase time) Stair Management: Alternating pattern, Step to pattern, Forwards, One rail Right Number of Stairs: 10 General stair comments: VC's for sequencing and general safety.   Wheelchair Mobility    Modified Rankin (Stroke Patients Only)       Balance Overall balance assessment: Needs assistance Sitting-balance support: No upper extremity supported, Feet supported Sitting balance-Leahy Scale: Good     Standing balance support: Single extremity supported, Reliant on assistive device for balance Standing balance-Leahy Scale: Fair                              Cognition Arousal/Alertness: Awake/alert Behavior During Therapy: WFL for tasks assessed/performed Overall Cognitive Status: Within Functional Limits for tasks assessed                                          Exercises Total Joint Exercises Quad Sets: 20 reps Heel Slides: 20 reps Hip ABduction/ADduction: 20 reps Long Arc Quad: 20 reps Standing Hip Extension: 10 reps    General Comments        Pertinent Vitals/Pain Pain Assessment Pain Assessment: 0-10 Pain Score: 1  Pain Descriptors /  Indicators: Tightness    Home Living                          Prior Function            PT Goals (current goals can now be found in the care plan section) Acute Rehab PT Goals Patient Stated Goal: to return to independence PT Goal Formulation: With patient Time For Goal Achievement: 07/28/22 Potential to Achieve Goals: Good Progress towards PT goals: Progressing  toward goals    Frequency    7X/week      PT Plan Current plan remains appropriate    Co-evaluation              AM-PAC PT "6 Clicks" Mobility   Outcome Measure  Help needed turning from your back to your side while in a flat bed without using bedrails?: None Help needed moving from lying on your back to sitting on the side of a flat bed without using bedrails?: None Help needed moving to and from a bed to a chair (including a wheelchair)?: None Help needed standing up from a chair using your arms (e.g., wheelchair or bedside chair)?: None Help needed to walk in hospital room?: None Help needed climbing 3-5 steps with a railing? : None 6 Click Score: 24    End of Session Equipment Utilized During Treatment: Gait belt Activity Tolerance: Patient tolerated treatment well Patient left: in chair;with call bell/phone within reach Nurse Communication: Mobility status PT Visit Diagnosis: Other abnormalities of gait and mobility (R26.89);Muscle weakness (generalized) (M62.81)     Time: 1610-9604 PT Time Calculation (min) (ACUTE ONLY): 26 min  Charges:  $Gait Training: 8-22 mins $Therapeutic Exercise: 8-22 mins                     Conni Slipper, PT, DPT Acute Rehabilitation Services Secure Chat Preferred Office: [phone]    Marylynn Pearson 07/25/2022, 9:24 AM

## 2022-07-25 NOTE — Progress Notes (Signed)
Subjective: 1 Day Post-Op Procedure(s) (LRB): RIGHT TOTAL HIP ARTHROPLASTY ANTERIOR APPROACH (Right) Patient reports pain as mild.  Nausea/vomiting yesterday which has improved.  Main issue has been hypertension for which she has been struggling with the past several months.  Notes pcp changed her meds about 3 months ago which has not improved things.    Objective: Vital signs in last 24 hours: Temp:  [97 F (36.1 C)-99.1 F (37.3 C)] 98.9 F (37.2 C) (05/07 0535) Pulse Rate:  [43-73] 64 (05/07 0535) Resp:  [11-25] 20 (05/07 0535) BP: (141-223)/(85-105) 157/97 (05/07 0535) SpO2:  [94 %-100 %] 99 % (05/07 0535)  Intake/Output from previous day: 05/06 0701 - 05/07 0700 In: 1363.8 [P.O.:240; I.V.:1123.8] Out: 1450 [Urine:1300; Blood:150] Intake/Output this shift: No intake/output data recorded.  No results for input(s): "HGB" in the last 72 hours. No results for input(s): "WBC", "RBC", "HCT", "PLT" in the last 72 hours. No results for input(s): "NA", "K", "CL", "CO2", "BUN", "CREATININE", "GLUCOSE", "CALCIUM" in the last 72 hours. No results for input(s): "LABPT", "INR" in the last 72 hours.  Neurologically intact Neurovascular intact Sensation intact distally Intact pulses distally Dorsiflexion/Plantar flexion intact Incision: dressing C/D/I No cellulitis present Compartment soft   Assessment/Plan: 1 Day Post-Op Procedure(s) (LRB): RIGHT TOTAL HIP ARTHROPLASTY ANTERIOR APPROACH (Right) Advance diet Up with therapy WBAT RLE Hypertension- will continue to monitor.   D/c home-Once BP is stable, she can d/c home but has been instructed to call pcp this am to further discuss bp meds.       Cristie Hem 07/25/2022, 7:40 AM

## 2022-07-25 NOTE — Telephone Encounter (Signed)
Patient calls nurse line in regards to blood pressure.   She reports she is still in the hospital post op total hip. She reports they will not discharge her home until her blood pressure is stable.   She reports she was told to call PCP office for BP management prior to discharge.   See notes.  Will forward to PCP.

## 2022-07-25 NOTE — Evaluation (Signed)
Occupational Therapy Evaluation Patient Details Name: Veronica Pollard MRN: [id number] DOB: May 06, 1970 Today's Date: 07/25/2022   History of Present Illness Pt is a 52 y/o female presents to [facility] hospital on 07/24/2022 for elective R THA. PMH includes HTN, IBS, OSA, OA.   Clinical Impression   This 52 yo admitted and underwent above presents to acute OT with all education completed, no further OT needs, we will D/C from acute OT.      Recommendations for follow up therapy are one component of a multi-disciplinary discharge planning process, led by the attending physician.  Recommendations may be updated based on patient status, additional functional criteria and insurance authorization.   Assistance Recommended at Discharge None     Functional Status Assessment  Patient has not had a recent decline in their functional status  Equipment Recommendations  None recommended by OT       Precautions / Restrictions Precautions Precautions: Fall Restrictions Weight Bearing Restrictions: No RLE Weight Bearing: Weight bearing as tolerated      Mobility Bed Mobility               General bed mobility comments: pt sitting on EOB finishing up with PT when I arrived    Transfers Overall transfer level: Independent Equipment used: None                      Balance Overall balance assessment: Mild deficits observed, not formally tested                                         ADL either performed or assessed with clinical judgement   ADL Overall ADL's : Modified independent                                       General ADL Comments: Educated on sequence of dressing lower body and side step into tub with operated leg     Vision Baseline Vision/History: 1 Wears glasses Ability to See in Adequate Light: 0 Adequate Patient Visual Report: No change from baseline              Pertinent Vitals/Pain Pain Assessment Pain Assessment:  No/denies pain     Hand Dominance Right   Extremity/Trunk Assessment Upper Extremity Assessment Upper Extremity Assessment: Overall WFL for tasks assessed           Communication Communication Communication: No difficulties   Cognition Arousal/Alertness: Awake/alert Behavior During Therapy: WFL for tasks assessed/performed Overall Cognitive Status: Within Functional Limits for tasks assessed                                                  Home Living Family/patient expects to be discharged to:: Private residence Living Arrangements: Spouse/significant other Available Help at Discharge: Family;Available PRN/intermittently Type of Home: House Home Access: Stairs to enter [employer] of Steps: 4 Entrance Stairs-Rails: Can reach both Home Layout: Two level Alternate Level Stairs-Number of Steps: 15 Alternate Level Stairs-Rails: Right Bathroom Shower/Tub: Tub/shower unit (garden tub)   Bathroom Toilet: Standard     Home Equipment: Cane - single point;Toilet riser;Grab bars - tub/shower  Prior Functioning/Environment Prior Level of Function : Independent/Modified Independent;Driving                        OT Problem List: Impaired balance (sitting and/or standing) (very mild)         OT Goals(Current goals can be found in the care plan section) Acute Rehab OT Goals Patient Stated Goal: home today         AM-PAC OT "6 Clicks" Daily Activity     Outcome Measure Help from another person eating meals?: None Help from another person taking care of personal grooming?: None Help from another person toileting, which includes using toliet, bedpan, or urinal?: None Help from another person bathing (including washing, rinsing, drying)?: None Help from another person to put on and taking off regular upper body clothing?: None Help from another person to put on and taking off regular lower body clothing?: None 6 Click  Score: 24   End of Session Nurse Communication:  (no further OT needs)  Activity Tolerance: Patient tolerated treatment well Patient left:  (sitting EOB)  OT Visit Diagnosis: Muscle weakness (generalized) (M62.81)                Time: 1610-9604 OT Time Calculation (min): 20 min Charges:  OT General Charges $OT Visit: 1 Visit OT Evaluation $OT Eval Low Complexity: 1 Low  Lindon Romp OT Acute Rehabilitation Services Office [phone]    Evette Georges 07/25/2022, 9:58 AM

## 2022-07-25 NOTE — Telephone Encounter (Signed)
Tried to call patient. No answer. No voicemail. Patient's insurance is not in network with [facility]. Dr.Xu wants patient to focus on walking at home, instead of doing physical therapy.

## 2022-07-25 NOTE — Telephone Encounter (Signed)
Patient contacted and advised of apt scheduled for Thursday.

## 2022-07-25 NOTE — Plan of Care (Signed)
Pt doing well. Pt given D/C instructions with verbal understanding. Rx's were sent to the pharmacy by MD. Pt's incision is clean and dry with no sign of infection. Pt's IV was removed prior to D/C. Pt received RW from [employer] per MD order. Pt D/C'd home via walking per MD order. Pt is stable @ D/C and has no other needs at this time. Rema Fendt, RN

## 2022-07-25 NOTE — Discharge Summary (Signed)
Patient ID: Veronica Pollard MRN: [id number] DOB/AGE: 1970-09-09 52 y.o.  Admit date: 07/24/2022 Discharge date: 07/25/2022  Admission Diagnoses:  Principal Problem:   Primary osteoarthritis of right hip Active Problems:   Status post total replacement of right hip   Discharge Diagnoses:  Same  Past Medical History:  Diagnosis Date   Carpal tunnel syndrome    Complication of anesthesia    High cholesterol    Hypertension    IBS (irritable bowel syndrome)    Osteoarthritis    PONV (postoperative nausea and vomiting)    Renal disorder    early stage kidney disease - patient states not correct   Ruptured disc, cervical     Surgeries: Procedure(s): RIGHT TOTAL HIP ARTHROPLASTY ANTERIOR APPROACH on 07/24/2022   Consultants:   Discharged Condition: Improved  Hospital Course: Veronica Pollard is an 52 y.o. female who was admitted 07/24/2022 for operative treatment ofPrimary osteoarthritis of right hip. Patient has severe unremitting pain that affects sleep, daily activities, and work/hobbies. After pre-op clearance the patient was taken to the operating room on 07/24/2022 and underwent  Procedure(s): RIGHT TOTAL HIP ARTHROPLASTY ANTERIOR APPROACH.    Patient was given perioperative antibiotics:  Anti-infectives (From admission, onward)    Start     Dose/Rate Route Frequency Ordered Stop   07/24/22 1400  ceFAZolin (ANCEF) IVPB 2g/100 mL premix        2 g 200 mL/hr over 30 Minutes Intravenous Every 6 hours 07/24/22 1130 07/24/22 2014   07/24/22 0823  vancomycin (VANCOCIN) powder  Status:  Discontinued          As needed 07/24/22 0823 07/24/22 0945   07/24/22 0730  ceFAZolin (ANCEF) IVPB 2g/100 mL premix        2 g 200 mL/hr over 30 Minutes Intravenous On call to O.R. 07/24/22 0716 07/24/22 0825   07/24/22 0721  ceFAZolin (ANCEF) 2-4 GM/100ML-% IVPB       Note to Pharmacy: Amanda Pea M: cabinet override      07/24/22 0721 07/24/22 0804        Patient was given sequential  compression devices, early ambulation, and chemoprophylaxis to prevent DVT.  Patient benefited maximally from hospital stay and there were no complications.    Recent vital signs: Patient Vitals for the past 24 hrs:  BP Temp Temp src Pulse Resp SpO2  07/25/22 0743 (!) 149/92 98.4 F (36.9 C) Oral 64 20 100 %  07/25/22 0535 (!) 157/97 98.9 F (37.2 C) Oral 64 20 99 %  07/24/22 2346 (!) 153/88 99.1 F (37.3 C) Oral 73 20 100 %  07/24/22 2135 (!) 189/100 98.2 F (36.8 C) -- 66 20 100 %  07/24/22 2049 (!) 217/97 -- -- 66 -- 100 %  07/24/22 1945 (!) 223/99 98.2 F (36.8 C) Oral (!) 55 20 100 %  07/24/22 1636 (!) 197/104 98 F (36.7 C) Oral (!) 48 20 95 %  07/24/22 1151 (!) 184/85 97.7 F (36.5 C) Oral (!) 51 20 100 %  07/24/22 1130 (!) 170/86 (!) 97 F (36.1 C) -- (!) 49 (!) 23 96 %     Recent laboratory studies:  Recent Labs    07/25/22 0719  WBC 19.6*  HGB 10.0*  HCT 32.1*  PLT 318     Discharge Medications:   Allergies as of 07/25/2022       Reactions   Penicillins Shortness Of Breath, Swelling   Swollen throat, difficult breathing   Cefazolin Itching   Itching, tongue swelling  Tramadol Nausea And Vomiting        Medication List     STOP taking these medications    cyclobenzaprine 5 MG tablet Commonly known as: FLEXERIL   ibuprofen 200 MG tablet Commonly known as: ADVIL   traMADol 50 MG tablet Commonly known as: ULTRAM       TAKE these medications    aspirin EC 81 MG tablet Take 1 tablet (81 mg total) by mouth 2 (two) times daily. To be taken after surgery to prevent blood clots   dicyclomine 20 MG tablet Commonly known as: BENTYL Take 1 tablet (20 mg total) by mouth 3 (three) times daily as needed for spasms.   docusate sodium 100 MG capsule Commonly known as: Colace Take 1 capsule (100 mg total) by mouth daily as needed.   methocarbamol 750 MG tablet Commonly known as: Robaxin-750 Take 1 tablet (750 mg total) by mouth 2 (two) times  daily as needed for muscle spasms.   metoprolol succinate 100 MG 24 hr tablet Commonly known as: TOPROL-XL TAKE 1 TABLET(100 MG) BY MOUTH AT BEDTIME WITH OR IMMEDIATELY FOLLOWING A MEAL What changed: See the new instructions.   ondansetron 4 MG tablet Commonly known as: Zofran Take 1 tablet (4 mg total) by mouth every 8 (eight) hours as needed for nausea or vomiting.   oxyCODONE-acetaminophen 5-325 MG tablet Commonly known as: Percocet Take 1-2 tablets by mouth every 6 (six) hours as needed. To be taken after surgery   rosuvastatin 20 MG tablet Commonly known as: Crestor Take 1 tablet (20 mg total) by mouth daily. What changed: when to take this               Durable Medical Equipment  (From admission, onward)           Start     Ordered   07/24/22 1157  DME Walker rolling  Once       Question:  Patient needs a walker to treat with the following condition  Answer:  History of hip replacement   07/24/22 1156   07/24/22 1157  DME 3 n 1  Once        07/24/22 1156   07/24/22 1157  DME Bedside commode  Once       Question:  Patient needs a bedside commode to treat with the following condition  Answer:  History of hip replacement   07/24/22 1156            Diagnostic Studies: DG HIP PORT UNILAT WITH PELVIS 1V RIGHT  Result Date: 07/24/2022 CLINICAL DATA:  Right hip arthroplasty. EXAM: DG HIP (WITH OR WITHOUT PELVIS) 1V PORT RIGHT COMPARISON:  Pelvis radiographs 06/27/2022. FINDINGS: Postsurgical changes reflecting right hip arthroplasty are seen. Hardware alignment is within expected limits, without evidence of complication. There is expected surrounding postoperative soft tissue gas. The SI joints and symphysis pubis are intact. Left hip alignment is normal. IMPRESSION: Status post right hip arthroplasty without evidence of complication. Electronically Signed   By: Lesia Hausen M.D.   On: 07/24/2022 15:38   DG Pelvis Portable  Result Date: 07/24/2022 CLINICAL DATA:   04540 Hip joint replacement status [id number] EXAM: PORTABLE PELVIS 1-2 VIEWS COMPARISON:  None Available. FINDINGS: Postsurgical changes of right hip arthroplasty. Normal alignment. No evidence of periprosthetic fracture. No loosening. Expected soft tissue changes. IMPRESSION: Postsurgical changes of right hip arthroplasty. Normal alignment. No evidence of immediate hardware complication. Electronically Signed   By: Caprice Renshaw M.D.   On: 07/24/2022  10:26   DG HIP UNILAT WITH PELVIS 1V RIGHT  Result Date: 07/24/2022 CLINICAL DATA:  [id number] Elective surgery [id number] EXAM: DG HIP (WITH OR WITHOUT PELVIS) 1V RIGHT COMPARISON:  Radiograph 01/17/2022 FINDINGS: Intraoperative images during right hip arthroplasty. Normal alignment. No evidence of loosening or periprosthetic fracture. Expected soft tissue changes. IMPRESSION: Intraoperative images during right hip arthroplasty. Normal alignment. No evidence of immediate hardware complication. Electronically Signed   By: Caprice Renshaw M.D.   On: 07/24/2022 10:24   DG C-Arm 1-60 Min-No Report  Result Date: 07/24/2022 Fluoroscopy was utilized by the requesting physician.  No radiographic interpretation.   DG C-Arm 1-60 Min-No Report  Result Date: 07/24/2022 Fluoroscopy was utilized by the requesting physician.  No radiographic interpretation.   XR Pelvis 1-2 Views  Result Date: 06/27/2022 Advanced degenerative joint disease with bone on bone joint space narrowing.   Disposition: Discharge disposition: 01-Home or Self Care          Follow-up Information     Tarry Kos, MD. Schedule an appointment as soon as possible for a visit in 2 week(s).   Specialty: Orthopedic Surgery Contact information: [address] [place] [address] [phone]         [facility] Follow up.   Why: The home health agency will contact you for the first home visit. Contact information: [address] [place]  [address] [phone]                  Signed: Cristie Hem 07/25/2022, 11:21 AM

## 2022-07-26 ENCOUNTER — Telehealth: Payer: Self-pay

## 2022-07-26 NOTE — Telephone Encounter (Signed)
Patient calls nurse line to report a low BP.   She reports twice today her BP has been lows 100s/60s. She reports she has been compliant with BP meds.   She denies any dizziness or vision changes.  Patient advised to keep apt tomorrow with PCP for blood pressure control.    Precautions discussed.

## 2022-07-26 NOTE — Telephone Encounter (Signed)
Called and spoke with patient.

## 2022-07-26 NOTE — Transitions of Care (Post Inpatient/ED Visit) (Signed)
07/26/2022  Name: Veronica Pollard MRN: [id number] DOB: 1970/04/15  Today's TOC FU Call Status: Today's TOC FU Call Status:: Successful TOC FU Call Competed TOC FU Call Complete Date: 07/26/22  Transition Care Management Follow-up Telephone Call Date of Discharge: 07/25/22 Discharge Facility: [facility] Type of Discharge: Inpatient Admission Primary Inpatient Discharge Diagnosis:: right hip replacement How have you been since you were released from the hospital?: Better Any questions or concerns?: No  Items Reviewed: Did you receive and understand the discharge instructions provided?: Yes Medications obtained,verified, and reconciled?: Yes (Medications Reviewed) Any new allergies since your discharge?: No Dietary orders reviewed?: Yes Do you have support at home?: Yes People in Home: spouse  Medications Reviewed Today: Medications Reviewed Today     Reviewed by Karena Addison, LPN (Licensed Practical Nurse) on 07/26/22 at 1133  Med List Status: <None>   Medication Order Taking? Sig Documenting Provider Last Dose Status Informant  aspirin EC 81 MG tablet [id number] Yes Take 1 tablet (81 mg total) by mouth 2 (two) times daily. To be taken after surgery to prevent blood clots Cristie Hem, PA-C Taking Active   dicyclomine (BENTYL) 20 MG tablet [id number] Yes Take 1 tablet (20 mg total) by mouth 3 (three) times daily as needed for spasms. Alicia Amel, MD Taking Active   docusate sodium (COLACE) 100 MG capsule [id number] Yes Take 1 capsule (100 mg total) by mouth daily as needed. Cristie Hem, PA-C Taking Active   methocarbamol (ROBAXIN-750) 750 MG tablet [id number] Yes Take 1 tablet (750 mg total) by mouth 2 (two) times daily as needed for muscle spasms. Cristie Hem, PA-C Taking Active   metoprolol succinate (TOPROL-XL) 100 MG 24 hr tablet [id number] Yes TAKE 1 TABLET(100 MG) BY MOUTH AT BEDTIME WITH OR IMMEDIATELY FOLLOWING A MEAL  Patient taking differently: Take  100 mg by mouth daily in the afternoon. TAKE 1 TABLET(100 MG) BY MOUTH AT BEDTIME WITH OR IMMEDIATELY FOLLOWING A MEAL   Alicia Amel, MD Taking Active   ondansetron (ZOFRAN) 4 MG tablet [id number] Yes Take 1 tablet (4 mg total) by mouth every 8 (eight) hours as needed for nausea or vomiting. Cristie Hem, PA-C Taking Active   oxyCODONE-acetaminophen (PERCOCET) 5-325 MG tablet [id number] Yes Take 1-2 tablets by mouth every 6 (six) hours as needed. To be taken after surgery Cristie Hem, PA-C Taking Active   rosuvastatin (CRESTOR) 20 MG tablet [id number] Yes Take 1 tablet (20 mg total) by mouth daily.  Patient taking differently: Take 20 mg by mouth daily in the afternoon.   Alicia Amel, MD Taking Active             Home Care and Equipment/Supplies: Were Home Health Services Ordered?: Yes Name of Home Health Agency:: unknown Has Agency set up a time to come to your home?: No Any new equipment or medical supplies ordered?: Yes Were you able to get the equipment/medical supplies?: Yes Do you have any questions related to the use of the equipment/supplies?: No  Functional Questionnaire: Do you need assistance with bathing/showering or dressing?: Yes Do you need assistance with meal preparation?: No Do you need assistance with eating?: No Do you have difficulty maintaining continence: No Do you need assistance with getting out of bed/getting out of a chair/moving?: Yes Do you have difficulty managing or taking your medications?: No  Follow up appointments reviewed: PCP Follow-up appointment confirmed?: Yes Date of PCP follow-up appointment?: 07/27/22 Follow-up Provider: [facility] Follow-up  appointment confirmed?: Yes Date of Specialist follow-up appointment?: 08/08/22 Follow-Up Specialty Provider:: Dr Roda Shutters Do you need transportation to your follow-up appointment?: No Do you understand care options if your condition(s) worsen?: Yes-patient verbalized  understanding    SIGNATURE Karena Addison, LPN [facility] Nurse Health Advisor Direct Dial [phone]

## 2022-07-26 NOTE — Telephone Encounter (Signed)
Called patient to discuss. Feeling sore and run down after surgery, but overall doing okay. Had a BP reading of 109/59 which concerned her after having readings that were regularly high in the hospital. No symptoms of hypotension. Has been taking her BP meds as directed. Has an appt to see me in the morning. Advised to keep this appointment.   Eliezer Mccoy, MD

## 2022-07-26 NOTE — H&P (Signed)
PREOPERATIVE H&P  Chief Complaint: right hip osteoarthritis  HPI: Veronica Pollard is a 52 y.o. female who presents for surgical treatment of right hip osteoarthritis.  She denies any changes in medical history.  Past Medical History:  Diagnosis Date   Carpal tunnel syndrome    Complication of anesthesia    High cholesterol    Hypertension    IBS (irritable bowel syndrome)    Osteoarthritis    PONV (postoperative nausea and vomiting)    Renal disorder    early stage kidney disease - patient states not correct   Ruptured disc, cervical    Past Surgical History:  Procedure Laterality Date   ABDOMINAL HYSTERECTOMY     BREAST BIOPSY Right 2014   benign   CARPAL TUNNEL RELEASE     CHOLECYSTECTOMY     COLONOSCOPY     HAMMER TOE SURGERY     x 3   TOTAL HIP ARTHROPLASTY Right 07/24/2022   Procedure: RIGHT TOTAL HIP ARTHROPLASTY ANTERIOR APPROACH;  Surgeon: Tarry Kos, MD;  Location: [facility] OR;  Service: Orthopedics;  Laterality: Right;  3-C   Social History   Socioeconomic History   Marital status: Married    Spouse name: Fayrene Fearing   Number of children: 1   Years of education: Not on file   Highest education level: Not on file  Occupational History   Not on file  Tobacco Use   Smoking status: Light Smoker    Types: Cigarettes   Smokeless tobacco: Never   Tobacco comments:    1-2 cigarettes a day  Substance and Sexual Activity   Alcohol use: Yes    Comment: socially   Drug use: No   Sexual activity: Not Currently  Other Topics Concern   Not on file  Social History Narrative   1 adopted son   Social Determinants of Health   Financial Resource Strain: Not on file  Food Insecurity: Not on file  Transportation Needs: Not on file  Physical Activity: Not on file  Stress: Not on file  Social Connections: Not on file   Family History  Problem Relation Age of Onset   Breast cancer Mother    Diabetes Mother    High blood pressure Mother    High Cholesterol Mother     Clotting disorder Father    Heart disease Father    Diabetes Father    Early death Father    High Cholesterol Father    High blood pressure Father    Breast cancer Maternal Aunt    Alcohol abuse Sister    Depression Sister    High blood pressure Sister    Drug abuse Sister    Allergies  Allergen Reactions   Penicillins Shortness Of Breath and Swelling    Swollen throat, difficult breathing   Cefazolin Itching    Itching, tongue swelling   Tramadol Nausea And Vomiting   Prior to Admission medications   Medication Sig Start Date End Date Taking? Authorizing Provider  metoprolol succinate (TOPROL-XL) 100 MG 24 hr tablet TAKE 1 TABLET(100 MG) BY MOUTH AT BEDTIME WITH OR IMMEDIATELY FOLLOWING A MEAL Patient taking differently: Take 100 mg by mouth daily in the afternoon. TAKE 1 TABLET(100 MG) BY MOUTH AT BEDTIME WITH OR IMMEDIATELY FOLLOWING A MEAL 05/25/22  Yes Alicia Amel, MD  rosuvastatin (CRESTOR) 20 MG tablet Take 1 tablet (20 mg total) by mouth daily. Patient taking differently: Take 20 mg by mouth daily in the afternoon. 04/28/22  Yes [place],  Barbera Setters, MD  aspirin EC 81 MG tablet Take 1 tablet (81 mg total) by mouth 2 (two) times daily. To be taken after surgery to prevent blood clots 07/13/22 07/13/23  Cristie Hem, PA-C  dicyclomine (BENTYL) 20 MG tablet Take 1 tablet (20 mg total) by mouth 3 (three) times daily as needed for spasms. 04/26/22   Alicia Amel, MD  docusate sodium (COLACE) 100 MG capsule Take 1 capsule (100 mg total) by mouth daily as needed. 07/13/22 07/13/23  Cristie Hem, PA-C  methocarbamol (ROBAXIN-750) 750 MG tablet Take 1 tablet (750 mg total) by mouth 2 (two) times daily as needed for muscle spasms. 07/13/22   Cristie Hem, PA-C  ondansetron (ZOFRAN) 4 MG tablet Take 1 tablet (4 mg total) by mouth every 8 (eight) hours as needed for nausea or vomiting. 07/13/22   Cristie Hem, PA-C  oxyCODONE-acetaminophen (PERCOCET) 5-325 MG tablet Take 1-2  tablets by mouth every 6 (six) hours as needed. To be taken after surgery 07/13/22   Cristie Hem, PA-C     Positive ROS: All other systems have been reviewed and were otherwise negative with the exception of those mentioned in the HPI and as above.  Physical Exam: General: Alert, no acute distress Cardiovascular: No pedal edema Respiratory: No cyanosis, no use of accessory musculature GI: abdomen soft Skin: No lesions in the area of chief complaint Neurologic: Sensation intact distally Psychiatric: Patient is competent for consent with normal mood and affect Lymphatic: no lymphedema  MUSCULOSKELETAL: exam stable  Assessment: right hip osteoarthritis  Plan: Plan for Procedure(s): RIGHT TOTAL HIP ARTHROPLASTY ANTERIOR APPROACH  The risks benefits and alternatives were discussed with the patient including but not limited to the risks of nonoperative treatment, versus surgical intervention including infection, bleeding, nerve injury,  blood clots, cardiopulmonary complications, morbidity, mortality, among others, and they were willing to proceed.   Glee Arvin, MD 07/26/2022 7:59 PM

## 2022-07-27 ENCOUNTER — Other Ambulatory Visit: Payer: Self-pay

## 2022-07-27 ENCOUNTER — Encounter: Payer: Self-pay | Admitting: Student

## 2022-07-27 ENCOUNTER — Telehealth: Payer: Self-pay | Admitting: Orthopaedic Surgery

## 2022-07-27 ENCOUNTER — Ambulatory Visit: Payer: Managed Care, Other (non HMO) | Admitting: Student

## 2022-07-27 VITALS — BP 126/70 | HR 64 | Ht 69.0 in | Wt 245.6 lb

## 2022-07-27 DIAGNOSIS — I10 Essential (primary) hypertension: Secondary | ICD-10-CM | POA: Diagnosis not present

## 2022-07-27 DIAGNOSIS — Z96641 Presence of right artificial hip joint: Secondary | ICD-10-CM | POA: Diagnosis not present

## 2022-07-27 DIAGNOSIS — Z0000 Encounter for general adult medical examination without abnormal findings: Secondary | ICD-10-CM | POA: Insufficient documentation

## 2022-07-27 NOTE — Assessment & Plan Note (Signed)
Doing very well postoperatively.  Has follow-up with Ortho surgery next week.  Ambulatory today.  Did have a fall where she fell on her left side.  No bruising or tenderness of the side on exam today.  Anticipate she will continue to do well.  She is motivated to get back into the gym.  I encouraged this, she plans to get back to full activities as tolerated. -Follow-up with orthopedic surgery next week -ASA 81 mg twice daily DVT Ppx -Using cane for stability, this is appropriate especially given her fall down the stairs earlier this week

## 2022-07-27 NOTE — Patient Instructions (Signed)
Try not fall down steps anymore! Glad you are feeling okay. Your BP is beautiful today. Let's not make any changes.   If [facility] has not already called you to schedule your colonoscopy, please call them at [phone].  We have not yet opened up and are scheduled for July/August yet, but give [place] a call in the next 4 weeks or so and make an appointment with me in July or August for follow-up.  Eliezer Mccoy, MD

## 2022-07-27 NOTE — Progress Notes (Signed)
SUBJECTIVE:   CHIEF COMPLAINT / HPI:   HTN Recent brief hospitalization for total right hip replacement. Was noted by the orthopedic surgery team to be quite hypertensive while hospitalized, up to 223/99.  Thankfully, was downtrending to the time of discharge, most recently 149/92.  They requested that she have close interval PCP follow-up for this blood pressure issue.  She subsequently called our office on postop day 2 to report a home blood pressure reading of 109/59.  Her only blood pressure medication is Toprol-XL 100 mg daily.  She is aware this is an odd regimen and is unclear why this is what she is on but notes that she has been quite well controlled on this for years. Prefers to not make any changes without a compelling reason..   S/p hip replacement  Fall Did have a fall the day after surgery (missed a step and fell down the stairs).  Thankfully, fell on the left side.  She denies any significant pain in her bruising after this fall.  She is on twice daily aspirin for DVT prophylaxis per Ortho.  Did not hit her head. Plans to get back to the gym by the end of June.   Health Maintenance Is due for colonoscopy.  Has been referred to [facility] per her preference.  Awaiting this being done.  Had been delayed due to the hip surgery.  PERTINENT  PMH / PSH: History of significant weight loss, previously weight up to 400 pounds.  Has lost weight through diet and exercise alone.  Works as a Administrator. HLD on Crestor 20   OBJECTIVE:   BP 126/70 Comment: large cuff  Pulse 64   Ht 5\' 9"  (1.753 m)   Wt 245 lb 9.6 oz (111.4 kg)   SpO2 100%   BMI 36.27 kg/m   Physical Exam Constitutional:      Comments: Upbeat, in good spirits  HENT:     Mouth/Throat:     Mouth: Mucous membranes are moist.  Cardiovascular:     Rate and Rhythm: Normal rate and regular rhythm.     Pulses: Normal pulses.  Pulmonary:     Effort: Pulmonary effort is normal.  Musculoskeletal:      Comments: Right hip with postoperative bandage clean, dry, and place.  Minimal associated bruising.  Left hip without tenderness or bruising after fall.  Neurological:     Comments: Antalgic gait, walking with cane, able to ambulate >115ft      ASSESSMENT/PLAN:   HYPERTENSION, BENIGN SYSTEMIC Suspect that the elevated readings in the hospital were related to pain, stress reaction after surgery.  Reassured by normal readings today.  Suspect that the borderline reading on arrival was due to using a cuff that was somewhat too small.  Reading with the large cuff was 126/70.  She does not have diabetes or known kidney disease, therefore do not have a compelling reason to transition her from her metoprolol to a more typical first-line BP agent.  Per her preference, we will continue current management. -Metoprolol succinate 100 mg daily  Status post total replacement of right hip Doing very well postoperatively.  Has follow-up with Ortho surgery next week.  Ambulatory today.  Did have a fall where she fell on her left side.  No bruising or tenderness of the side on exam today.  Anticipate she will continue to do well.  She is motivated to get back into the gym.  I encouraged this, she plans to get  back to full activities as tolerated. -Follow-up with orthopedic surgery next week -ASA 81 mg twice daily DVT Ppx -Using cane for stability, this is appropriate especially given her fall down the stairs earlier this week  Healthcare maintenance Advised to f/u with [facility] for colonoscopy     J Dorothyann Gibbs, MD [facility]

## 2022-07-27 NOTE — Telephone Encounter (Signed)
Patient called about HHPT for post hip replacement not being covered by [place] because it is out of network.  She is asking if there is another HHPT in network she can use in the area or if she can start with outpatient therapy ASAP.  Please call patient at [phone].

## 2022-07-27 NOTE — Assessment & Plan Note (Signed)
Advised to f/u with [facility] for colonoscopy

## 2022-07-27 NOTE — Assessment & Plan Note (Signed)
Suspect that the elevated readings in the hospital were related to pain, stress reaction after surgery.  Reassured by normal readings today.  Suspect that the borderline reading on arrival was due to using a cuff that was somewhat too small.  Reading with the large cuff was 126/70.  She does not have diabetes or known kidney disease, therefore do not have a compelling reason to transition her from her metoprolol to a more typical first-line BP agent.  Per her preference, we will continue current management. -Metoprolol succinate 100 mg daily

## 2022-07-27 NOTE — Telephone Encounter (Signed)
Spoke with patient. I have submitted her HHPT referral to [facility]. Just waiting to see if they are able to assist patient with PT. Patient understands and is fine without PT if this company isn't in network with [place].

## 2022-08-08 ENCOUNTER — Ambulatory Visit (INDEPENDENT_AMBULATORY_CARE_PROVIDER_SITE_OTHER): Payer: Managed Care, Other (non HMO) | Admitting: Physician Assistant

## 2022-08-08 DIAGNOSIS — Z96641 Presence of right artificial hip joint: Secondary | ICD-10-CM

## 2022-08-08 NOTE — Progress Notes (Signed)
Post-Op Visit Note   Patient: Veronica Pollard           Date of Birth: 1970/10/18           MRN: [id number] Visit Date: 08/08/2022 PCP: Alicia Amel, MD   Assessment & Plan:  Chief Complaint:  Chief Complaint  Patient presents with   Right Hip - Routine Post Op   Visit Diagnoses:  1. Status post total replacement of right hip     Plan: Patient is a pleasant 52 year old female who comes in today 2 weeks status post right total hip replacement 07/24/2022.  She has been doing great.  She has not been taking pain medicine for the past few days and is not in any pain at this time.  She has been fairly compliant taking a baby aspirin twice daily for DVT prophylaxis.  Examination of the right hip reveals a well-healed surgical incision with nylon sutures in place.  No evidence of infection or cellulitis.  She does have moderate swelling the right lower extremity.  Calves are soft and nontender.  At this point, she is doing well.  Continue to advance with activity as tolerated.  I have encouraged her to continue wearing her compression socks due to the swelling.  Should she develop any calf pain she will let me know.  Follow-up with [place] in 4 weeks for repeat evaluation and AP pelvis x-rays.  Postop instruction handout provided.  Call with concerns or questions.  Follow-Up Instructions: Return in about 4 weeks (around 09/05/2022).   Orders:  No orders of the defined types were placed in this encounter.  No orders of the defined types were placed in this encounter.   Imaging:  No new imaging   PMFS History: Patient Active Problem List   Diagnosis Date Noted   Healthcare maintenance 07/27/2022   Status post total replacement of right hip 07/24/2022   Upper extremity pain 05/02/2022   Pre-op evaluation 04/26/2022   Bradycardia 09/23/2020   Sleep apnea 08/11/2020   Arthritis 08/11/2020   Prediabetes 10/03/2018   BMI 33.0-33.9,adult 10/03/2018   Bilateral hip pain 08/22/2018   Neck  pain 08/22/2018   Primary osteoarthritis of right hip 12/04/2017   HYPERCHOLESTEROLEMIA 05/25/2010   FIBROIDS, UTERUS 05/12/2010   EXTERNAL HEMORRHOIDS 05/12/2010   IRRITABLE BOWEL SYNDROME 05/12/2010   CALLUSES, LEFT FOOT 05/12/2010   DEGENERATIVE DISC DISEASE, CERVICAL SPINE 05/12/2010   CARPAL TUNNEL SYNDROME, BILATERAL, HX OF 05/12/2010   OBESITY, NOS 05/17/2006   ANEMIA, IRON DEFICIENCY, UNSPEC. 05/17/2006   HYPERTENSION, BENIGN SYSTEMIC 05/17/2006   Past Medical History:  Diagnosis Date   Carpal tunnel syndrome    Complication of anesthesia    High cholesterol    Hypertension    IBS (irritable bowel syndrome)    Osteoarthritis    PONV (postoperative nausea and vomiting)    Renal disorder    early stage kidney disease - patient states not correct   Ruptured disc, cervical     Family History  Problem Relation Age of Onset   Breast cancer Mother    Diabetes Mother    High blood pressure Mother    High Cholesterol Mother    Clotting disorder Father    Heart disease Father    Diabetes Father    Early death Father    High Cholesterol Father    High blood pressure Father    Breast cancer Maternal Aunt    Alcohol abuse Sister    Depression Sister  High blood pressure Sister    Drug abuse Sister     Past Surgical History:  Procedure Laterality Date   ABDOMINAL HYSTERECTOMY     BREAST BIOPSY Right 2014   benign   CARPAL TUNNEL RELEASE     CHOLECYSTECTOMY     COLONOSCOPY     HAMMER TOE SURGERY     x 3   TOTAL HIP ARTHROPLASTY Right 07/24/2022   Procedure: RIGHT TOTAL HIP ARTHROPLASTY ANTERIOR APPROACH;  Surgeon: Tarry Kos, MD;  Location: [facility] OR;  Service: Orthopedics;  Laterality: Right;  3-C   Social History   Occupational History   Not on file  Tobacco Use   Smoking status: Light Smoker    Types: Cigarettes   Smokeless tobacco: Never   Tobacco comments:    1-2 cigarettes a day  Substance and Sexual Activity   Alcohol use: Yes    Comment: socially    Drug use: No   Sexual activity: Not Currently

## 2022-10-31 ENCOUNTER — Other Ambulatory Visit (INDEPENDENT_AMBULATORY_CARE_PROVIDER_SITE_OTHER): Payer: Managed Care, Other (non HMO)

## 2022-10-31 ENCOUNTER — Encounter: Payer: Self-pay | Admitting: Physician Assistant

## 2022-10-31 ENCOUNTER — Ambulatory Visit: Payer: Managed Care, Other (non HMO) | Admitting: Physician Assistant

## 2022-10-31 DIAGNOSIS — Z96641 Presence of right artificial hip joint: Secondary | ICD-10-CM

## 2022-10-31 NOTE — Progress Notes (Signed)
Post-Op Visit Note   Patient: Veronica Pollard           Date of Birth: 1971/02/13           MRN: [id number] Visit Date: 10/31/2022 PCP: Alicia Amel, MD   Assessment & Plan:  Chief Complaint:  Chief Complaint  Patient presents with   Right Hip - Follow-up    Right total hip arthroplasty 07/24/2022   Visit Diagnoses:  1. Status post total replacement of right hip     Plan: Patient is a pleasant 52 year old female who comes in today 3 months status post right total hip replacement 07/24/2022.  She has been doing great.  No complaints.  Examination of her right hip reveals painless hip flexion and logroll.  She is neurovascularly intact distally.  At this point, she will continue to advance with activity as tolerated.  Dental prophylaxis reinforced.  Follow-up in 3 months for repeat evaluation.  Call with concerns or questions in meantime.  Follow-Up Instructions: Return in about 3 months (around 01/31/2023).   Orders:  Orders Placed This Encounter  Procedures   XR Pelvis 1-2 Views   No orders of the defined types were placed in this encounter.   Imaging: XR Pelvis 1-2 Views  Result Date: 10/31/2022 Well-seated prosthesis without complication   PMFS History: Patient Active Problem List   Diagnosis Date Noted   Healthcare maintenance 07/27/2022   Status post total replacement of right hip 07/24/2022   Upper extremity pain 05/02/2022   Pre-op evaluation 04/26/2022   Bradycardia 09/23/2020   Sleep apnea 08/11/2020   Arthritis 08/11/2020   Prediabetes 10/03/2018   BMI 33.0-33.9,adult 10/03/2018   Bilateral hip pain 08/22/2018   Neck pain 08/22/2018   Primary osteoarthritis of right hip 12/04/2017   HYPERCHOLESTEROLEMIA 05/25/2010   FIBROIDS, UTERUS 05/12/2010   EXTERNAL HEMORRHOIDS 05/12/2010   IRRITABLE BOWEL SYNDROME 05/12/2010   CALLUSES, LEFT FOOT 05/12/2010   DEGENERATIVE DISC DISEASE, CERVICAL SPINE 05/12/2010   CARPAL TUNNEL SYNDROME, BILATERAL, HX OF  05/12/2010   OBESITY, NOS 05/17/2006   ANEMIA, IRON DEFICIENCY, UNSPEC. 05/17/2006   HYPERTENSION, BENIGN SYSTEMIC 05/17/2006   Past Medical History:  Diagnosis Date   Carpal tunnel syndrome    Complication of anesthesia    High cholesterol    Hypertension    IBS (irritable bowel syndrome)    Osteoarthritis    PONV (postoperative nausea and vomiting)    Renal disorder    early stage kidney disease - patient states not correct   Ruptured disc, cervical     Family History  Problem Relation Age of Onset   Breast cancer Mother    Diabetes Mother    High blood pressure Mother    High Cholesterol Mother    Clotting disorder Father    Heart disease Father    Diabetes Father    Early death Father    High Cholesterol Father    High blood pressure Father    Breast cancer Maternal Aunt    Alcohol abuse Sister    Depression Sister    High blood pressure Sister    Drug abuse Sister     Past Surgical History:  Procedure Laterality Date   ABDOMINAL HYSTERECTOMY     BREAST BIOPSY Right 2014   benign   CARPAL TUNNEL RELEASE     CHOLECYSTECTOMY     COLONOSCOPY     HAMMER TOE SURGERY     x 3   TOTAL HIP ARTHROPLASTY Right 07/24/2022   Procedure:  RIGHT TOTAL HIP ARTHROPLASTY ANTERIOR APPROACH;  Surgeon: Tarry Kos, MD;  Location: [facility] OR;  Service: Orthopedics;  Laterality: Right;  3-C   Social History   Occupational History   Not on file  Tobacco Use   Smoking status: Light Smoker    Types: Cigarettes   Smokeless tobacco: Never   Tobacco comments:    1-2 cigarettes a day  Substance and Sexual Activity   Alcohol use: Yes    Comment: socially   Drug use: No   Sexual activity: Not Currently

## 2022-12-13 ENCOUNTER — Encounter (HOSPITAL_COMMUNITY): Payer: Self-pay

## 2022-12-13 ENCOUNTER — Ambulatory Visit (HOSPITAL_COMMUNITY)
Admission: RE | Admit: 2022-12-13 | Discharge: 2022-12-13 | Disposition: A | Discharge status: Home / Self Care | Payer: Managed Care, Other (non HMO) | Source: Ambulatory Visit | Attending: Physician Assistant | Admitting: Physician Assistant

## 2022-12-13 VITALS — BP 149/89 | HR 85 | Temp 98.2°F | Resp 18

## 2022-12-13 DIAGNOSIS — I10 Essential (primary) hypertension: Secondary | ICD-10-CM | POA: Diagnosis not present

## 2022-12-13 DIAGNOSIS — H02846 Edema of left eye, unspecified eyelid: Secondary | ICD-10-CM

## 2022-12-13 DIAGNOSIS — H00014 Hordeolum externum left upper eyelid: Secondary | ICD-10-CM | POA: Diagnosis not present

## 2022-12-13 MED ORDER — SULFAMETHOXAZOLE-TRIMETHOPRIM 800-160 MG PO TABS: 1.0000 | ORAL_TABLET | Freq: Two times a day (BID) | ORAL | 0 refills | Status: AC

## 2022-12-13 MED ORDER — ERYTHROMYCIN 5 MG/GM OP OINT: TOPICAL_OINTMENT | OPHTHALMIC | 0 refills | Status: DC

## 2022-12-13 NOTE — Discharge Instructions (Signed)
Start Bactrim DS twice daily for 7 days.  Use warm compresses multiple times a day on the eye.  Apply erythromycin ointment to the eye twice daily.  Do not touch the tip of medication bottle to the eye and make sure to wash your hands before handling medication to prevent contamination of the medicine.  Throw away your old make-up and get new stuff.  If your symptoms are not improving within a few days please follow-up with ophthalmology.  If anything worsens you need to be seen immediately including high fever, increased swelling, pain when you move your eyes, vision change.  Your blood pressure was elevated.  I believe this is because you are sick and have not taken your medication.  Please go home and take your medicine ASAP.  Please avoid NSAIDs (aspirin, ibuprofen/Advil, naproxen/Aleve), decongestants, caffeine, sodium.  Monitor your blood pressure at home and follow-up with your primary care within a week.  If you develop any chest pain, shortness of breath, headache, vision change, dizziness in the setting of high blood pressure you need to go to the emergency room.

## 2022-12-13 NOTE — ED Provider Notes (Signed)
[facility]    CSN: [id number] Arrival date & time: 12/13/22  4098      History   Chief Complaint No chief complaint on file.   HPI Veronica Pollard is a 52 y.o. female.   Patient presents today with a 3 to 4-day history of left upper eyelid swelling.  She reports that she was on vacation and used some older make-up and then developed a bump on her eyelid.  This is has since started draining and she has had some purulent drainage from the eyelid.  She denies any involvement of the eye and denies any ocular pain, visual disturbance outside of the swelling of the eyelid, erythema of the eye, fever, nausea, vomiting.  She does wear glasses but does not wear contacts.  She has tried Visine without improvement of symptoms.  Denies any recent antibiotic use.  She does not follow-up with an ophthalmologist regularly.  She is having difficulty with daily activities as a result of the symptoms.    Past Medical History:  Diagnosis Date   Carpal tunnel syndrome    Complication of anesthesia    High cholesterol    Hypertension    IBS (irritable bowel syndrome)    Osteoarthritis    PONV (postoperative nausea and vomiting)    Renal disorder    early stage kidney disease - patient states not correct   Ruptured disc, cervical     Patient Active Problem List   Diagnosis Date Noted   Healthcare maintenance 07/27/2022   Status post total replacement of right hip 07/24/2022   Upper extremity pain 05/02/2022   Pre-op evaluation 04/26/2022   Bradycardia 09/23/2020   Sleep apnea 08/11/2020   Arthritis 08/11/2020   Prediabetes 10/03/2018   BMI 33.0-33.9,adult 10/03/2018   Bilateral hip pain 08/22/2018   Neck pain 08/22/2018   Primary osteoarthritis of right hip 12/04/2017   HYPERCHOLESTEROLEMIA 05/25/2010   FIBROIDS, UTERUS 05/12/2010   EXTERNAL HEMORRHOIDS 05/12/2010   IRRITABLE BOWEL SYNDROME 05/12/2010   CALLUSES, LEFT FOOT 05/12/2010   DEGENERATIVE DISC DISEASE, CERVICAL  SPINE 05/12/2010   CARPAL TUNNEL SYNDROME, BILATERAL, HX OF 05/12/2010   OBESITY, NOS 05/17/2006   ANEMIA, IRON DEFICIENCY, UNSPEC. 05/17/2006   HYPERTENSION, BENIGN SYSTEMIC 05/17/2006    Past Surgical History:  Procedure Laterality Date   ABDOMINAL HYSTERECTOMY     BREAST BIOPSY Right 2014   benign   CARPAL TUNNEL RELEASE     CHOLECYSTECTOMY     COLONOSCOPY     HAMMER TOE SURGERY     x 3   TOTAL HIP ARTHROPLASTY Right 07/24/2022   Procedure: RIGHT TOTAL HIP ARTHROPLASTY ANTERIOR APPROACH;  Surgeon: Tarry Kos, MD;  Location: [facility];  Service: Orthopedics;  Laterality: Right;  3-C    OB History   No obstetric history on file.      Home Medications    Prior to Admission medications   Medication Sig Start Date End Date Taking? Authorizing Provider  aspirin EC 81 MG tablet Take 1 tablet (81 mg total) by mouth 2 (two) times daily. To be taken after surgery to prevent blood clots 07/13/22 07/13/23 Yes Cristie Hem, PA-C  erythromycin ophthalmic ointment Place a 1/2 inch ribbon of ointment into the lower eyelid of left eye BID for one week 12/13/22  Yes Avanelle Pixley K, PA-C  metoprolol succinate (TOPROL-XL) 100 MG 24 hr tablet TAKE 1 TABLET(100 MG) BY MOUTH AT BEDTIME WITH OR IMMEDIATELY FOLLOWING A MEAL Patient taking differently: Take 100 mg by  mouth daily in the afternoon. TAKE 1 TABLET(100 MG) BY MOUTH AT BEDTIME WITH OR IMMEDIATELY FOLLOWING A MEAL 05/25/22  Yes Alicia Amel, MD  sulfamethoxazole-trimethoprim (BACTRIM DS) 800-160 MG tablet Take 1 tablet by mouth 2 (two) times daily for 7 days. 12/13/22 12/20/22 Yes Alexandro Line, Noberto Retort, PA-C  dicyclomine (BENTYL) 20 MG tablet Take 1 tablet (20 mg total) by mouth 3 (three) times daily as needed for spasms. 04/26/22   Alicia Amel, MD  docusate sodium (COLACE) 100 MG capsule Take 1 capsule (100 mg total) by mouth daily as needed. 07/13/22 07/13/23  Cristie Hem, PA-C  methocarbamol (ROBAXIN-750) 750 MG tablet Take 1 tablet (750 mg  total) by mouth 2 (two) times daily as needed for muscle spasms. 07/13/22   Cristie Hem, PA-C  ondansetron (ZOFRAN) 4 MG tablet Take 1 tablet (4 mg total) by mouth every 8 (eight) hours as needed for nausea or vomiting. 07/13/22   Cristie Hem, PA-C  oxyCODONE-acetaminophen (PERCOCET) 5-325 MG tablet Take 1-2 tablets by mouth every 6 (six) hours as needed. To be taken after surgery 07/13/22   Cristie Hem, PA-C  rosuvastatin (CRESTOR) 20 MG tablet Take 1 tablet (20 mg total) by mouth daily. Patient taking differently: Take 20 mg by mouth daily in the afternoon. 04/28/22   Alicia Amel, MD    Family History Family History  Problem Relation Age of Onset   Breast cancer Mother    Diabetes Mother    High blood pressure Mother    High Cholesterol Mother    Clotting disorder Father    Heart disease Father    Diabetes Father    Early death Father    High Cholesterol Father    High blood pressure Father    Breast cancer Maternal Aunt    Alcohol abuse Sister    Depression Sister    High blood pressure Sister    Drug abuse Sister     Social History Social History   Tobacco Use   Smoking status: Light Smoker    Types: Cigarettes   Smokeless tobacco: Never   Tobacco comments:    1-2 cigarettes a day  Substance Use Topics   Alcohol use: Yes    Comment: socially   Drug use: No     Allergies   Penicillins, Cefazolin, and Tramadol   Review of Systems Review of Systems  Constitutional:  Positive for activity change. Negative for appetite change, fatigue and fever.  Eyes:  Negative for photophobia, pain, discharge, redness, itching and visual disturbance.  Respiratory:  Negative for shortness of breath.   Cardiovascular:  Negative for chest pain.  Gastrointestinal:  Negative for anal bleeding, diarrhea, nausea and vomiting.  Neurological:  Negative for dizziness, light-headedness and headaches.     Physical Exam Triage Vital Signs ED Triage Vitals [12/13/22 0841]   Encounter Vitals Group     BP (!) 167/114     Systolic BP Percentile      Diastolic BP Percentile      Pulse Rate 72     Resp 16     Temp 98.2 F (36.8 C)     Temp Source Oral     SpO2 93 %     Weight      Height      Head Circumference      Peak Flow      Pain Score      Pain Loc      Pain Education  Exclude from Growth Chart    No data found.  Updated Vital Signs BP (!) 149/89 (BP Location: Right Arm)   Pulse 85   Temp 98.2 F (36.8 C) (Oral)   Resp 18   SpO2 98%   Visual Acuity Right Eye Distance:   Left Eye Distance:   Bilateral Distance:    Right Eye Near:   Left Eye Near:    Bilateral Near:  unable to obtain  Physical Exam Vitals reviewed.  Constitutional:      General: She is awake. She is not in acute distress.    Appearance: Normal appearance. She is well-developed. She is not ill-appearing.     Comments: Very pleasant female appears stated age in no acute distress sitting comfortably in exam room  HENT:     Head: Normocephalic and atraumatic.  Eyes:     General:        Right eye: No discharge or hordeolum.        Left eye: Discharge and hordeolum present.    Extraocular Movements: Extraocular movements intact.     Conjunctiva/sclera: Conjunctivae normal.     Pupils: Pupils are equal, round, and reactive to light.     Comments: Significant swelling and erythema with painful nodule noted left upper eyelid.  No pain with extraocular movements.  PERRLA.  Cardiovascular:     Rate and Rhythm: Normal rate and regular rhythm.     Heart sounds: Normal heart sounds, S1 normal and S2 normal. No murmur heard. Pulmonary:     Effort: Pulmonary effort is normal.     Breath sounds: Normal breath sounds. No wheezing, rhonchi or rales.     Comments: Clear to auscultation bilaterally Psychiatric:        Behavior: Behavior is cooperative.      UC Treatments / Results  Labs (all labs ordered are listed, but only abnormal results are displayed) Labs  Reviewed - No data to display  EKG   Radiology No results found.  Procedures Procedures (including critical care time)  Medications Ordered in UC Medications - No data to display  Initial Impression / Assessment and Plan / UC Course  I have reviewed the triage vital signs and the nursing notes.  Pertinent labs & imaging results that were available during my care of the patient were reviewed by me and considered in my medical decision making (see chart for details).     Patient is well-appearing, afebrile, nontoxic, nontachycardic.  She had difficulty performing visual acuity because of the amount of swelling.  She denies any ocular complaints and only has eyelid swelling.  Given the severity of swelling will start Bactrim DS given her penicillin allergy.  She is also to use warm compresses and apply erythromycin ointment twice daily.  Discussed that she is to avoid touching the tip of medication bottle to the eye and wash hands prior to handling medication to prevent contamination of the medicine.  If her symptoms are not improving quickly she is to follow-up with ophthalmology and was given contact information for local provider who is on-call.  Discussed that if she has any worsening or changing symptoms she needs to go to the emergency room including fever, increased swelling, visual disturbance, ocular pain, nausea, vomiting.  Strict return precautions given.  Work excuse note provided.  Blood pressure was addressed during visit and recommended patient monitor this at home.  Reports she has not taken her medication today and was encouraged to go home and take the medicine.  She denies any signs/symptoms of endorgan damage.  She should follow up with PCP for ongoing management. Discussed alarm symptoms that would warrant emergent evaluation to which patient expressed understanding.    Final Clinical Impressions(s) / UC Diagnoses   Final diagnoses:  Hordeolum externum of left upper  eyelid  Swelling of left eyelid  Elevated blood pressure reading in office with diagnosis of hypertension     Discharge Instructions      Start Bactrim DS twice daily for 7 days.  Use warm compresses multiple times a day on the eye.  Apply erythromycin ointment to the eye twice daily.  Do not touch the tip of medication bottle to the eye and make sure to wash your hands before handling medication to prevent contamination of the medicine.  Throw away your old make-up and get new stuff.  If your symptoms are not improving within a few days please follow-up with ophthalmology.  If anything worsens you need to be seen immediately including high fever, increased swelling, pain when you move your eyes, vision change.  Your blood pressure was elevated.  I believe this is because you are sick and have not taken your medication.  Please go home and take your medicine ASAP.  Please avoid NSAIDs (aspirin, ibuprofen/Advil, naproxen/Aleve), decongestants, caffeine, sodium.  Monitor your blood pressure at home and follow-up with your primary care within a week.  If you develop any chest pain, shortness of breath, headache, vision change, dizziness in the setting of high blood pressure you need to go to the emergency room.      ED Prescriptions     Medication Sig Dispense Auth. Provider   sulfamethoxazole-trimethoprim (BACTRIM DS) 800-160 MG tablet Take 1 tablet by mouth 2 (two) times daily for 7 days. 14 tablet Tzipora Mcinroy K, PA-C   erythromycin ophthalmic ointment Place a 1/2 inch ribbon of ointment into the lower eyelid of left eye BID for one week 3.5 g Elon Lomeli K, PA-C      PDMP not reviewed this encounter.   Jeani Hawking, PA-C 12/13/22 4098

## 2022-12-13 NOTE — ED Triage Notes (Signed)
Pt presents to the office for left eye pain and swelling x 3 days. Pt thinks she use some old make-up.

## 2023-01-02 ENCOUNTER — Ambulatory Visit (INDEPENDENT_AMBULATORY_CARE_PROVIDER_SITE_OTHER): Payer: Managed Care, Other (non HMO)

## 2023-01-02 ENCOUNTER — Other Ambulatory Visit: Payer: Self-pay

## 2023-01-02 ENCOUNTER — Encounter: Payer: Self-pay | Admitting: Orthopaedic Surgery

## 2023-01-02 ENCOUNTER — Ambulatory Visit: Payer: Managed Care, Other (non HMO) | Admitting: Orthopaedic Surgery

## 2023-01-02 ENCOUNTER — Ambulatory Visit: Payer: Managed Care, Other (non HMO) | Admitting: Sports Medicine

## 2023-01-02 DIAGNOSIS — M722 Plantar fascial fibromatosis: Secondary | ICD-10-CM

## 2023-01-02 DIAGNOSIS — M1612 Unilateral primary osteoarthritis, left hip: Secondary | ICD-10-CM

## 2023-01-02 MED ORDER — LIDOCAINE HCL 1 % IJ SOLN
4.0000 mL | INTRAMUSCULAR | Status: AC | PRN
Start: 2023-01-02 — End: 2023-01-02
  Administered 2023-01-02: 4 mL

## 2023-01-02 MED ORDER — METHYLPREDNISOLONE ACETATE 40 MG/ML IJ SUSP
80.0000 mg | INTRAMUSCULAR | Status: AC | PRN
Start: 2023-01-02 — End: 2023-01-02
  Administered 2023-01-02: 80 mg via INTRA_ARTICULAR

## 2023-01-02 NOTE — Progress Notes (Signed)
Office Visit Note   Patient: Veronica Pollard           Date of Birth: 14-Jun-1970           MRN: [id number] Visit Date: 01/02/2023              Requested by: Alicia Amel, MD [address] [place],  [place] [address] PCP: Alicia Amel, MD   Assessment & Plan: Visit Diagnoses:  1. Plantar fasciitis of right foot   2. Primary osteoarthritis of left hip     Plan: Onetha is a 52 year old female with right heel pain and left hip pain.  Impression is left hip osteoarthritis flare and right heel plantar fasciitis.  We will provide her with home exercises for the plantar fasciitis.  She will go to the [employer] store.  Home exercises demonstrated.  For the left hip we will send her to Dr. Shon Baton for cortisone injection.  Follow-up as needed.  Follow-Up Instructions: No follow-ups on file.   Orders:  Orders Placed This Encounter  Procedures   XR HIP UNILAT W OR W/O PELVIS 2-3 VIEWS LEFT   XR Os Calcis Right   No orders of the defined types were placed in this encounter.     Procedures: No procedures performed   Clinical Data: No additional findings.   Subjective: Chief Complaint  Patient presents with   Left Hip - Pain   Right Foot - Pain    HPI Veronica Pollard is a 52 year old female who comes in for left hip pain and right heel pain.  Left hip went out on her 4 days ago and feels like there is groin pain that wraps around to the buttock.  She is reporting right heel pain for 2 weeks which is worse with standing and activity.  Ibuprofen is not helping. Review of Systems  Constitutional: Negative.   HENT: Negative.    Eyes: Negative.   Respiratory: Negative.    Cardiovascular: Negative.   Endocrine: Negative.   Musculoskeletal: Negative.   Neurological: Negative.   Hematological: Negative.   Psychiatric/Behavioral: Negative.    All other systems reviewed and are negative.    Objective: Vital Signs: There were no vitals taken for this visit.  Physical Exam Vitals  and nursing note reviewed.  Constitutional:      Appearance: She is well-developed.  HENT:     Head: Atraumatic.     Nose: Nose normal.  Eyes:     Extraocular Movements: Extraocular movements intact.  Cardiovascular:     Pulses: Normal pulses.  Pulmonary:     Effort: Pulmonary effort is normal.  Abdominal:     Palpations: Abdomen is soft.  Musculoskeletal:     Cervical back: Neck supple.  Skin:    General: Skin is warm.     Capillary Refill: Capillary refill takes less than 2 seconds.  Neurological:     Mental Status: She is alert. Mental status is at baseline.  Psychiatric:        Behavior: Behavior normal.        Thought Content: Thought content normal.        Judgment: Judgment normal.     Ortho Exam Exam of the right heel shows central tenderness plantarly.  Slight tenderness to the insertion of the Achilles.  Exam of the left hip shows pain with internal/external rotation and flexion.  Pain with logroll. Specialty Comments:  No specialty comments available.  Imaging: No results found.   PMFS History: Patient Active  Problem List   Diagnosis Date Noted   Plantar fasciitis of right foot 01/02/2023   Healthcare maintenance 07/27/2022   Status post total replacement of right hip 07/24/2022   Upper extremity pain 05/02/2022   Pre-op evaluation 04/26/2022   Bradycardia 09/23/2020   Sleep apnea 08/11/2020   Arthritis 08/11/2020   Prediabetes 10/03/2018   BMI 33.0-33.9,adult 10/03/2018   Bilateral hip pain 08/22/2018   Neck pain 08/22/2018   Primary osteoarthritis of right hip 12/04/2017   HYPERCHOLESTEROLEMIA 05/25/2010   FIBROIDS, UTERUS 05/12/2010   External hemorrhoids 05/12/2010   IRRITABLE BOWEL SYNDROME 05/12/2010   CALLUSES, LEFT FOOT 05/12/2010   DEGENERATIVE DISC DISEASE, CERVICAL SPINE 05/12/2010   CARPAL TUNNEL SYNDROME, BILATERAL, HX OF 05/12/2010   OBESITY, NOS 05/17/2006   ANEMIA, IRON DEFICIENCY, UNSPEC. 05/17/2006   HYPERTENSION, BENIGN  SYSTEMIC 05/17/2006   Past Medical History:  Diagnosis Date   Carpal tunnel syndrome    Complication of anesthesia    High cholesterol    Hypertension    IBS (irritable bowel syndrome)    Osteoarthritis    PONV (postoperative nausea and vomiting)    Renal disorder    early stage kidney disease - patient states not correct   Ruptured disc, cervical     Family History  Problem Relation Age of Onset   Breast cancer Mother    Diabetes Mother    High blood pressure Mother    High Cholesterol Mother    Clotting disorder Father    Heart disease Father    Diabetes Father    Early death Father    High Cholesterol Father    High blood pressure Father    Breast cancer Maternal Aunt    Alcohol abuse Sister    Depression Sister    High blood pressure Sister    Drug abuse Sister     Past Surgical History:  Procedure Laterality Date   ABDOMINAL HYSTERECTOMY     BREAST BIOPSY Right 2014   benign   CARPAL TUNNEL RELEASE     CHOLECYSTECTOMY     COLONOSCOPY     HAMMER TOE SURGERY     x 3   TOTAL HIP ARTHROPLASTY Right 07/24/2022   Procedure: RIGHT TOTAL HIP ARTHROPLASTY ANTERIOR APPROACH;  Surgeon: Tarry Kos, MD;  Location: [facility];  Service: Orthopedics;  Laterality: Right;  3-C   Social History   Occupational History   Not on file  Tobacco Use   Smoking status: Light Smoker    Types: Cigarettes   Smokeless tobacco: Never   Tobacco comments:    1-2 cigarettes a day  Substance and Sexual Activity   Alcohol use: Yes    Comment: socially   Drug use: No   Sexual activity: Not Currently

## 2023-01-02 NOTE — Progress Notes (Signed)
Procedure Note  Patient: Veronica Pollard             Date of Birth: 1971/02/15           MRN: [id number]             Visit Date: 01/02/2023  Procedures: Visit Diagnoses:  1. Primary osteoarthritis of left hip    Large Joint Inj: L hip joint on 01/02/2023 10:02 AM Indications: pain Details: 22 G 3.5 in needle, ultrasound-guided anterior approach Medications: 4 mL lidocaine 1 %; 80 mg methylPREDNISolone acetate 40 MG/ML Outcome: tolerated well, no immediate complications  Procedure: US-guided intra-articular hip injection, Left After discussion on risks/benefits/indications and informed verbal consent was obtained, a timeout was performed. Patient was lying supine on exam table. The hip was cleaned with betadine and alcohol swabs. Then utilizing ultrasound guidance, the patient's femoral head and neck junction was identified and subsequently injected with 4:2 lidocaine:depomedrol via an in-plane approach with ultrasound visualization of the injectate administered into the hip joint. Patient tolerated procedure well without immediate complications.  Procedure, treatment alternatives, risks and benefits explained, specific risks discussed. Consent was given by the patient. Immediately prior to procedure a time out was called to verify the correct patient, procedure, equipment, support staff and site/side marked as required. Patient was prepped and draped in the usual sterile fashion.     - I evaluated the patient about 5 minutes post-injection and she had vast improvement in pain and range of motion --> went from using a wheelchair to ambulate freely minutes after injection - follow-up with Dr. Roda Shutters as indicated; I am happy to see them as needed  Madelyn Brunner, DO Primary Care Sports Medicine Physician  [facility]  This note was dictated using Dragon naturally speaking software and may contain errors in syntax, spelling, or content which have not been identified prior  to signing this note.

## 2023-03-11 NOTE — Progress Notes (Unsigned)
    SUBJECTIVE:   CHIEF COMPLAINT / HPI:   Night Sweats Here to discuss worsening night sweats. Tells me she has had night sweats on-and-off since her 30s but they've gotten significantly worse over the past 6 months or so. Associated symptoms include mid-abdominal cramping worse in the mornings and generalized pruritus only at night and with the sweating episodes. No recent weight changes.  Of note, she had a right hip replacement in May of this year. She is not UTD on colon CA screening. She does endorse chronic blood in the stool but has attributed this to known hemorrhoids (she has a history of hemorrhoidectomy).  She is s/p total hysterectomy so does not have a cervix. She does still have ovaries.   HTN BP persistently elevated x2 today. Looking back at recent orthopedic visits, has been elevated there as well. Previously just on metoprolol succinate 100mg  nightly. At our last visit we discussed that this was an atypical regimen, but given that she was well-controlled at that time, we elected to NOT make any changes. She is amenable to making some medication changes. She does not know why she has been on such an atypical regimen.  PERTINENT  PMH / PSH: Recent hip replacement (6 months), prediabetes,   OBJECTIVE:   BP (!) 181/93   Pulse (!) 57   Ht 5\' 9"  (1.753 m)   Wt 247 lb (112 kg)   SpO2 100%   BMI 36.48 kg/m   Gen: well-appearing, NAD, in good spirits  HENT: Without LAD Cardio: RRR, without murmur Pulm: Normal WOB on RA, lungs clear in all fields Abd: Soft, non-tender, without hepatosplenomegaly, no mass  ASSESSMENT/PLAN:   Assessment & Plan Night sweats In a 52 year old female, these are most likely related to menopause, though difficult to say given her s/p hysterectomy. Of course, malignant and infectious causes need to be worked-up/ruled out. She is a smoker, though thankfully quite light cigarette use (just 4 cigs/week right now).  - CXR today - CT Chest to be  scheduled - Colonoscopy ordered - Repeat mammography in Jan - CBC, CMP, TSH, HIV, CRP Prediabetes A1c stable at 5.7%. Remains active, exercising in the gym. Continue these lifestyle interventions.  HYPERTENSION, BENIGN SYSTEMIC Grossly elevated x2 today.Looking back at recent outpatient visits with her other docs (ortho), she has been high there as well.  - STOP metoprolol succinate 100mg  nightly - START telmisartan-hydrochlorothiazide 40-12.5mg  daily - Return in 2 weeks for BP follow-up and to further discuss night sweats workup.    Eliezer Mccoy, MD [facility]

## 2023-03-12 ENCOUNTER — Ambulatory Visit
Admission: RE | Admit: 2023-03-12 | Discharge: 2023-03-12 | Disposition: A | Discharge status: Home / Self Care | Payer: Managed Care, Other (non HMO) | Source: Ambulatory Visit | Attending: Family Medicine

## 2023-03-12 ENCOUNTER — Encounter: Payer: Self-pay | Admitting: Student

## 2023-03-12 ENCOUNTER — Ambulatory Visit (INDEPENDENT_AMBULATORY_CARE_PROVIDER_SITE_OTHER): Payer: Managed Care, Other (non HMO) | Admitting: Student

## 2023-03-12 VITALS — BP 181/93 | HR 57 | Ht 69.0 in | Wt 247.0 lb

## 2023-03-12 DIAGNOSIS — Z1211 Encounter for screening for malignant neoplasm of colon: Secondary | ICD-10-CM

## 2023-03-12 DIAGNOSIS — R61 Generalized hyperhidrosis: Secondary | ICD-10-CM

## 2023-03-12 DIAGNOSIS — R7303 Prediabetes: Secondary | ICD-10-CM

## 2023-03-12 DIAGNOSIS — I10 Essential (primary) hypertension: Secondary | ICD-10-CM | POA: Diagnosis not present

## 2023-03-12 LAB — POCT GLYCOSYLATED HEMOGLOBIN (HGB A1C): HbA1c, POC (prediabetic range): 5.7 % (ref 5.7–6.4)

## 2023-03-12 MED ORDER — TELMISARTAN-HCTZ 40-12.5 MG PO TABS
1.0000 | ORAL_TABLET | Freq: Every day | ORAL | 1 refills | Status: DC
Start: 2023-03-12 — End: 2023-06-18

## 2023-03-12 NOTE — Assessment & Plan Note (Signed)
Grossly elevated x2 today.Looking back at recent outpatient visits with her other docs (ortho), she has been high there as well.  - STOP metoprolol succinate 100mg  nightly - START telmisartan-hydrochlorothiazide 40-12.5mg  daily - Return in 2 weeks for BP follow-up and to further discuss night sweats workup.

## 2023-03-12 NOTE — Patient Instructions (Addendum)
Night sweats can just be a part of menopause, but I worry about other, more serious causes as well.    Please go to [facility] at [employer] to get your X-Ray done. They are open 7:30a-5p Monday-Friday. You do not need an appointment to get this done. You will also need a CT of the chest, but we can schedule this in the future.   You will need a colonoscopy since you are now >52 years old. I will have our GI doctors reach out to you about scheduling this.    I'm checking some labs.    I am also going to start you on a new BP medicine called "Micardis". You can take one tablet daily. Either at morning or night would be fine.  See you in two weeks.  Eliezer Mccoy, MD

## 2023-03-12 NOTE — Assessment & Plan Note (Addendum)
A1c stable at 5.7%. Remains active, exercising in the gym. Continue these lifestyle interventions.

## 2023-03-13 LAB — TSH RFX ON ABNORMAL TO FREE T4: TSH: 0.799 u[IU]/mL (ref 0.450–4.500)

## 2023-03-13 LAB — HIV ANTIBODY (ROUTINE TESTING W REFLEX): HIV Screen 4th Generation wRfx: NONREACTIVE

## 2023-03-13 LAB — CBC WITH DIFFERENTIAL/PLATELET
Basophils Absolute: 0.1 10*3/uL (ref 0.0–0.2)
Basos: 1 %
EOS (ABSOLUTE): 0.1 10*3/uL (ref 0.0–0.4)
Eos: 2 %
Hematocrit: 33.9 % — ABNORMAL LOW (ref 34.0–46.6)
Hemoglobin: 10.4 g/dL — ABNORMAL LOW (ref 11.1–15.9)
Immature Grans (Abs): 0 10*3/uL (ref 0.0–0.1)
Immature Granulocytes: 0 %
Lymphocytes Absolute: 2.3 10*3/uL (ref 0.7–3.1)
Lymphs: 33 %
MCH: 22.9 pg — ABNORMAL LOW (ref 26.6–33.0)
MCHC: 30.7 g/dL — ABNORMAL LOW (ref 31.5–35.7)
MCV: 75 fL — ABNORMAL LOW (ref 79–97)
Monocytes Absolute: 0.4 10*3/uL (ref 0.1–0.9)
Monocytes: 6 %
Neutrophils Absolute: 4 10*3/uL (ref 1.4–7.0)
Neutrophils: 58 %
Platelets: 247 10*3/uL (ref 150–450)
RBC: 4.54 x10E6/uL (ref 3.77–5.28)
RDW: 20.3 % — ABNORMAL HIGH (ref 11.7–15.4)
WBC: 6.8 10*3/uL (ref 3.4–10.8)

## 2023-03-13 LAB — COMPREHENSIVE METABOLIC PANEL
ALT: 10 [IU]/L (ref 0–32)
AST: 13 [IU]/L (ref 0–40)
Albumin: 4.1 g/dL (ref 3.8–4.9)
Alkaline Phosphatase: 85 [IU]/L (ref 44–121)
BUN/Creatinine Ratio: 12 (ref 9–23)
BUN: 11 mg/dL (ref 6–24)
Bilirubin Total: 0.3 mg/dL (ref 0.0–1.2)
CO2: 24 mmol/L (ref 20–29)
Calcium: 9 mg/dL (ref 8.7–10.2)
Chloride: 104 mmol/L (ref 96–106)
Creatinine, Ser: 0.91 mg/dL (ref 0.57–1.00)
Globulin, Total: 3 g/dL (ref 1.5–4.5)
Glucose: 97 mg/dL (ref 70–99)
Potassium: 3.7 mmol/L (ref 3.5–5.2)
Sodium: 144 mmol/L (ref 134–144)
Total Protein: 7.1 g/dL (ref 6.0–8.5)
eGFR: 75 mL/min/{1.73_m2} (ref >59)

## 2023-03-13 LAB — C-REACTIVE PROTEIN: CRP: 2 mg/L (ref 0–10)

## 2023-03-15 ENCOUNTER — Encounter: Payer: Self-pay | Admitting: Family Medicine

## 2023-03-22 ENCOUNTER — Encounter: Payer: Self-pay | Admitting: Student

## 2023-03-27 ENCOUNTER — Ambulatory Visit: Payer: Managed Care, Other (non HMO) | Admitting: Student

## 2023-03-27 ENCOUNTER — Encounter: Payer: Self-pay | Admitting: Student

## 2023-03-27 VITALS — BP 134/82 | HR 91 | Ht 70.0 in | Wt 248.0 lb

## 2023-03-27 DIAGNOSIS — R61 Generalized hyperhidrosis: Secondary | ICD-10-CM | POA: Insufficient documentation

## 2023-03-27 DIAGNOSIS — D509 Iron deficiency anemia, unspecified: Secondary | ICD-10-CM | POA: Diagnosis not present

## 2023-03-27 DIAGNOSIS — I10 Essential (primary) hypertension: Secondary | ICD-10-CM | POA: Diagnosis not present

## 2023-03-27 NOTE — Patient Instructions (Addendum)
 I want you to call [facility] and see if you can get scheduled sooner rather than later. You can let them know that you have an active referral from me. [phone]. I think you need both an upper and lower endoscopy studies given your persistent anemia.  We're going to check some other labs on you today.   I will call you if we need to make any changes to your meds (including your iron).  JINNY Con Gasman, MD

## 2023-03-27 NOTE — Assessment & Plan Note (Addendum)
 Improved control today, though her initial reading was quite high.  Given the triad of hypertension that has proven somewhat challenging to control taken together with anemia and borderline potassium, favor checking a renin aldosterone activity ratio. - Renin-aldo activity ratio

## 2023-03-27 NOTE — Progress Notes (Signed)
    SUBJECTIVE:   CHIEF COMPLAINT / HPI:   Night Sweats Seen by me two weeks ago for night sweats. Lab workup, including CBC, TSH, HIV, CRP, CMP unremarkable aside from re-demonstrated microcytic anemia (Hgb 10.4, MCV 75). Mentzer index suggestive of IDA as opposed to thalassemia. CXR with mild cardiomegaly, otherwise negative. Awaiting CT of the chest.  At her last visit I placed a referral to GI, however on review of the referrals tab it appears this may have been faxed to endocrinology rather than GI.  Given her anemia, it is likely that she needs both a colonoscopy in addition to an upper endoscopy.  No other obvious cause for her anemia, she no longer has uterus. She continues to have nightly, sheet soaking night sweats.  Today she does endorse occasional bitemporal headaches, but these are not particularly frequent or bothersome.  HTN BP initially grossly elevated on arrival today.  However, normalizing on repeat check.  She reports good adherence to her telmisartan -hydrochlorothiazide.   OBJECTIVE:   BP 134/82   Pulse 91   Ht 5' 10 (1.778 m)   Wt 248 lb (112.5 kg)   SpO2 100%   BMI 35.58 kg/m   General: alert & oriented, no apparent distress, well groomed HEENT: normocephalic, atraumatic, EOM grossly intact, oral mucosa moist, neck supple Respiratory: normal respiratory effort GI: non-distended Skin: no rashes, no jaundice Psych: appropriate mood and affect   ASSESSMENT/PLAN:   Night sweats Initial lab workup is overall reassuring.  We are still awaiting the CT scan.  We are also awaiting GI consult.  She does likely need upper and lower endoscopies.  I have the highest suspicion for this being just a menopausal symptom, though concern remains for possible GI malignancy given her anemia and having never had a colonoscopy. -Iron, ferritin, TIBC -I have resent the referral to GI given that the last referral may have been faxed endocrine instead -Awaiting CT scan of the  chest on 1/14 -If her BP remains difficult to control and her headaches become more of an issue and seems to be truly paroxysmal, could consider plasma metanephrines to workup for possible pheochromocytoma, though this seems much less likely at this point.  HYPERTENSION, BENIGN SYSTEMIC Improved control today, though her initial reading was quite high.  Given the triad of hypertension that has proven somewhat challenging to control taken together with anemia and borderline potassium, favor checking a renin aldosterone activity ratio. - Renin-aldo activity ratio     J Con Gasman, MD [facility]

## 2023-03-27 NOTE — Assessment & Plan Note (Signed)
 Initial lab workup is overall reassuring.  We are still awaiting the CT scan.  We are also awaiting GI consult.  She does likely need upper and lower endoscopies.  I have the highest suspicion for this being just a menopausal symptom, though concern remains for possible GI malignancy given her anemia and having never had a colonoscopy. -Iron, ferritin, TIBC -I have resent the referral to GI given that the last referral may have been faxed endocrine instead -Awaiting CT scan of the chest on 1/14 -If her BP remains difficult to control and her headaches become more of an issue and seems to be truly paroxysmal, could consider plasma metanephrines to workup for possible pheochromocytoma, though this seems much less likely at this point.

## 2023-04-02 LAB — IRON,TIBC AND FERRITIN PANEL
Ferritin: 18 ng/mL (ref 15–150)
Iron Saturation: 8 % — CL (ref 15–55)
Iron: 29 ug/dL (ref 27–159)
Total Iron Binding Capacity: 351 ug/dL (ref 250–450)
UIBC: 322 ug/dL (ref 131–425)

## 2023-04-02 LAB — ALDOSTERONE + RENIN ACTIVITY W/ RATIO
Aldos/Renin Ratio: 5.7 (ref 0.0–30.0)
Aldosterone: 3.9 ng/dL (ref 0.0–30.0)
Renin Activity, Plasma: 0.681 ng/mL/h (ref 0.167–5.380)

## 2023-04-03 ENCOUNTER — Ambulatory Visit
Admission: RE | Admit: 2023-04-03 | Discharge: 2023-04-03 | Disposition: A | Discharge status: Home / Self Care | Payer: Managed Care, Other (non HMO) | Source: Ambulatory Visit | Attending: Family Medicine

## 2023-04-03 ENCOUNTER — Encounter: Payer: Self-pay | Admitting: Family Medicine

## 2023-04-03 DIAGNOSIS — R61 Generalized hyperhidrosis: Secondary | ICD-10-CM

## 2023-04-03 MED ORDER — FERROUS SULFATE 324 (65 FE) MG PO TBEC
1.0000 | DELAYED_RELEASE_TABLET | ORAL | 1 refills | Status: DC
Start: 2023-04-03 — End: 2023-08-01

## 2023-04-03 NOTE — Addendum Note (Signed)
 Addended by: Darnelle Spangle B on: 04/03/2023 10:19 AM   Modules accepted: Orders

## 2023-04-10 ENCOUNTER — Telehealth: Payer: Self-pay | Admitting: Student

## 2023-04-10 ENCOUNTER — Encounter: Payer: Self-pay | Admitting: Family Medicine

## 2023-04-10 NOTE — Telephone Encounter (Signed)
Patient called stating that she just her test results on mychart and is confused about a few things. Asks if her doctor could please call her to discuss her results.

## 2023-04-30 ENCOUNTER — Ambulatory Visit: Payer: Self-pay | Admitting: Student

## 2023-05-03 ENCOUNTER — Telehealth: Payer: Self-pay

## 2023-05-03 NOTE — Telephone Encounter (Signed)
Patient calls nurse line in regards to blood pressure.   She reports she has an endoscopy and a colonoscopy yesterday. However, she reports both procedures were delayed due to high blood pressure. She reports when they first took it was ~200s/100s. She reports her BP eventually came down and they were able to proceed.   She does report during anesthesia she was told her BP dropped "very low." She reports she was told to go to the ED after procedures. She reports she was not interested in doing this due to wait times.   She denies any vision changes, headaches, chest pains or shortness of breath.   She reports compliance with blood pressure medications. She reports she takes daily.   Patient scheduled for tomorrow for evaluation.  ED precautions discussed with patient.

## 2023-05-04 ENCOUNTER — Ambulatory Visit: Payer: Managed Care, Other (non HMO) | Admitting: Student

## 2023-05-04 ENCOUNTER — Encounter: Payer: Self-pay | Admitting: Student

## 2023-05-04 VITALS — BP 130/78 | HR 77 | Ht 70.0 in | Wt 244.4 lb

## 2023-05-04 DIAGNOSIS — R61 Generalized hyperhidrosis: Secondary | ICD-10-CM

## 2023-05-04 DIAGNOSIS — R918 Other nonspecific abnormal finding of lung field: Secondary | ICD-10-CM

## 2023-05-04 DIAGNOSIS — D509 Iron deficiency anemia, unspecified: Secondary | ICD-10-CM

## 2023-05-04 MED ORDER — FEZOLINETANT 45 MG PO TABS
45.0000 mg | ORAL_TABLET | Freq: Every day | ORAL | 1 refills | Status: DC
Start: 2023-05-04 — End: 2023-08-01

## 2023-05-04 NOTE — Assessment & Plan Note (Signed)
Has had a colonoscopy.  Is scheduled for office follow-up with GI, likely will need upper endoscopy as well. -Celiac panel today -Repeat anemia panel at 1 month follow-up to give [place] sufficient time on iron supplementation

## 2023-05-04 NOTE — Progress Notes (Signed)
    SUBJECTIVE:   CHIEF COMPLAINT / HPI:   Night Sweats We have been working this up for the past several weeks.  Workup thus far has been unremarkable.  Given her smoking history and having never had a colonoscopy, there is obviously concern for lung cancer or colon cancer.  She had a CT of the chest that was without cancerous changes.  Though there was mention of possible pulmonary artery heart hypertension on the CT.  She just had a colonoscopy 2 days ago with many polyps removed, awaiting surgical pathology. She does me that she continues to soak the bed with her sweats nightly and also is having frequent hot flashes during the day.  We discussed that taking all of these things together, it is very likely that she is having vasomotor symptoms of menopause.  Though given that she is status post hysterectomy, saying this for certain is a bit challenging.  We discussed treatment option including hormonal and nonhormonal options.  She declines hormonal options given the VTE risk as her father died of a blood clot.  She is open to nonhormonal treatment.. She has a repeat mammogram scheduled for March.  Anemia Heretofore unexplained.  Quite iron deficient with a ferritin of 18 and an iron sat of 8%.  She does tell me that she feels better after starting oral iron supplementation.  Has no vaginal bleeding as she is status post hysterectomy.  Denies any known GI blood loss, though note multiple polyps removed during her colonoscopy earlier this week.  Awaiting surgical pathology as above.   Smoking Has not had any cigarettes since her colonoscopy!  Is hopeful that this will mark a major change for her.   OBJECTIVE:   BP 130/78   Pulse 77   Ht 5\' 10"  (1.778 m)   Wt 244 lb 6.4 oz (110.9 kg)   SpO2 100%   BMI 35.07 kg/m   General: alert & oriented, no apparent distress, well groomed HEENT: normocephalic, atraumatic, EOM grossly intact, oral mucosa moist, neck supple Respiratory: normal  respiratory effort GI: non-distended Skin: no rashes, no jaundice Psych: appropriate mood and affect   ASSESSMENT/PLAN:   Assessment & Plan Night sweats Workup has been unremarkable thus far.  Pending surgical pathology from her colonoscopy. -At this point I think going ahead and treating as though these are vasomotor symptoms of menopause is reasonable. -Start Veozah 45 mg daily -LFTs today, repeat in 1 month -F/u in 1 month Abnormal CT scan, lung Mention of possible pulmonary artery hypertension on CT read.  Certainly this warrants further evaluation with pulmonology. -Ambulatory referral to pulmonology Iron deficiency anemia, unspecified iron deficiency anemia type Has had a colonoscopy.  Is scheduled for office follow-up with GI, likely will need upper endoscopy as well. -Celiac panel today -Repeat anemia panel at 1 month follow-up to give [place] sufficient time on iron supplementation     J Dorothyann Gibbs, MD [facility]

## 2023-05-04 NOTE — Patient Instructions (Addendum)
Ms. Kirchner,  We will be eagerly awaiting the results of your colonoscopy samples. I will keep an eye out for these results from Dr. Matthias Hughs.  In the meantime, let's try a medicine called Veozah that might help with your symptoms. The risk is liver injury, so we'll need to check your liver function today and again in one month.  I've referred you to pulmonology to review your CT scan and consider treatment of pulmonary artery hypertension.  Keep taking the iron.   Make an appt on your way out to see me again in 1 month.   Eliezer Mccoy, MD

## 2023-05-04 NOTE — Assessment & Plan Note (Signed)
Workup has been unremarkable thus far.  Pending surgical pathology from her colonoscopy. -At this point I think going ahead and treating as though these are vasomotor symptoms of menopause is reasonable. -Start Veozah 45 mg daily -LFTs today, repeat in 1 month -F/u in 1 month

## 2023-05-05 ENCOUNTER — Other Ambulatory Visit: Payer: Self-pay

## 2023-05-05 ENCOUNTER — Encounter (HOSPITAL_COMMUNITY): Payer: Self-pay

## 2023-05-05 ENCOUNTER — Emergency Department (HOSPITAL_COMMUNITY)
Admission: EM | Admit: 2023-05-05 | Discharge: 2023-05-05 | Discharge status: Against Medical Advice | Payer: Managed Care, Other (non HMO) | Attending: Emergency Medicine | Admitting: Emergency Medicine

## 2023-05-05 DIAGNOSIS — R110 Nausea: Secondary | ICD-10-CM | POA: Insufficient documentation

## 2023-05-05 DIAGNOSIS — R197 Diarrhea, unspecified: Secondary | ICD-10-CM | POA: Diagnosis not present

## 2023-05-05 DIAGNOSIS — R109 Unspecified abdominal pain: Secondary | ICD-10-CM | POA: Insufficient documentation

## 2023-05-05 DIAGNOSIS — Z9889 Other specified postprocedural states: Secondary | ICD-10-CM | POA: Diagnosis not present

## 2023-05-05 DIAGNOSIS — Z5321 Procedure and treatment not carried out due to patient leaving prior to being seen by health care provider: Secondary | ICD-10-CM | POA: Insufficient documentation

## 2023-05-05 LAB — URINALYSIS, ROUTINE W REFLEX MICROSCOPIC
Bilirubin Urine: NEGATIVE
Glucose, UA: NEGATIVE mg/dL
Ketones, ur: NEGATIVE mg/dL
Leukocytes,Ua: NEGATIVE
Nitrite: NEGATIVE
Protein, ur: 30 mg/dL — AB
Specific Gravity, Urine: 1.023 (ref 1.005–1.030)
pH: 6 (ref 5.0–8.0)

## 2023-05-05 LAB — COMPREHENSIVE METABOLIC PANEL
ALT: 27 U/L (ref 0–44)
AST: 52 U/L — ABNORMAL HIGH (ref 15–41)
Albumin: 3.4 g/dL — ABNORMAL LOW (ref 3.5–5.0)
Alkaline Phosphatase: 63 U/L (ref 38–126)
Anion gap: 12 (ref 5–15)
BUN: 17 mg/dL (ref 6–20)
CO2: 26 mmol/L (ref 22–32)
Calcium: 9.2 mg/dL (ref 8.9–10.3)
Chloride: 100 mmol/L (ref 98–111)
Creatinine, Ser: 1.15 mg/dL — ABNORMAL HIGH (ref 0.44–1.00)
GFR, Estimated: 57 mL/min — ABNORMAL LOW (ref >60)
Glucose, Bld: 118 mg/dL — ABNORMAL HIGH (ref 70–99)
Potassium: 3 mmol/L — ABNORMAL LOW (ref 3.5–5.1)
Sodium: 138 mmol/L (ref 135–145)
Total Bilirubin: 0.6 mg/dL (ref 0.0–1.2)
Total Protein: 7.4 g/dL (ref 6.5–8.1)

## 2023-05-05 LAB — CBC
HCT: 35.9 % — ABNORMAL LOW (ref 36.0–46.0)
Hemoglobin: 11.4 g/dL — ABNORMAL LOW (ref 12.0–15.0)
MCH: 24.1 pg — ABNORMAL LOW (ref 26.0–34.0)
MCHC: 31.8 g/dL (ref 30.0–36.0)
MCV: 75.9 fL — ABNORMAL LOW (ref 80.0–100.0)
Platelets: 258 10*3/uL (ref 150–400)
RBC: 4.73 MIL/uL (ref 3.87–5.11)
RDW: 17.4 % — ABNORMAL HIGH (ref 11.5–15.5)
WBC: 11.1 10*3/uL — ABNORMAL HIGH (ref 4.0–10.5)
nRBC: 0 % (ref 0.0–0.2)

## 2023-05-05 LAB — LIPASE, BLOOD: Lipase: 24 U/L (ref 11–51)

## 2023-05-05 MED ORDER — ONDANSETRON 4 MG PO TBDP
4.0000 mg | ORAL_TABLET | Freq: Once | ORAL | Status: AC | PRN
  Administered 2023-05-05: 4 mg via ORAL
  Filled 2023-05-05: qty 1

## 2023-05-05 NOTE — ED Triage Notes (Addendum)
Pt had a colonoscopy 4 days ago and started having abdominal pain, nausea, and diarrhea this morning.  Pt states she had not had any problems after colonoscopy until today. Pt denies blood in stool.

## 2023-05-05 NOTE — ED Notes (Signed)
 Pt left without being seen.

## 2023-05-07 ENCOUNTER — Encounter: Payer: Self-pay | Admitting: Student

## 2023-05-07 LAB — CELIAC DISEASE COMPREHENSIVE PANEL WITH REFLEXES: IgA/Immunoglobulin A, Serum: 230 mg/dL (ref 87–352)

## 2023-05-07 LAB — HEPATIC FUNCTION PANEL
ALT: 22 [IU]/L (ref 0–32)
AST: 38 [IU]/L (ref 0–40)
Albumin: 4 g/dL (ref 3.8–4.9)
Alkaline Phosphatase: 89 [IU]/L (ref 44–121)
Bilirubin Total: 0.2 mg/dL (ref 0.0–1.2)
Bilirubin, Direct: 0.11 mg/dL (ref 0.00–0.40)
Total Protein: 7.2 g/dL (ref 6.0–8.5)

## 2023-05-09 ENCOUNTER — Other Ambulatory Visit: Payer: Self-pay | Admitting: Student

## 2023-05-09 DIAGNOSIS — E7800 Pure hypercholesterolemia, unspecified: Secondary | ICD-10-CM

## 2023-05-14 ENCOUNTER — Ambulatory Visit: Payer: Managed Care, Other (non HMO) | Admitting: Student

## 2023-05-28 ENCOUNTER — Other Ambulatory Visit: Payer: Self-pay | Admitting: Student

## 2023-05-28 DIAGNOSIS — Z1231 Encounter for screening mammogram for malignant neoplasm of breast: Secondary | ICD-10-CM

## 2023-05-29 ENCOUNTER — Ambulatory Visit: Payer: Managed Care, Other (non HMO) | Admitting: Student

## 2023-05-30 ENCOUNTER — Ambulatory Visit
Admission: RE | Admit: 2023-05-30 | Discharge: 2023-05-30 | Disposition: A | Discharge status: Home / Self Care | Payer: Self-pay | Source: Ambulatory Visit | Attending: Sports Medicine | Admitting: Sports Medicine

## 2023-05-30 DIAGNOSIS — Z1231 Encounter for screening mammogram for malignant neoplasm of breast: Secondary | ICD-10-CM

## 2023-06-18 ENCOUNTER — Other Ambulatory Visit: Payer: Self-pay | Admitting: Student

## 2023-06-18 DIAGNOSIS — I10 Essential (primary) hypertension: Secondary | ICD-10-CM

## 2023-06-27 ENCOUNTER — Other Ambulatory Visit: Payer: Self-pay | Admitting: Gastroenterology

## 2023-06-27 DIAGNOSIS — D369 Benign neoplasm, unspecified site: Secondary | ICD-10-CM

## 2023-07-18 ENCOUNTER — Ambulatory Visit
Admission: RE | Admit: 2023-07-18 | Discharge: 2023-07-18 | Disposition: A | Discharge status: Home / Self Care | Source: Ambulatory Visit | Attending: Gastroenterology | Admitting: Gastroenterology

## 2023-07-18 DIAGNOSIS — D369 Benign neoplasm, unspecified site: Secondary | ICD-10-CM

## 2023-07-18 MED ORDER — IOPAMIDOL (ISOVUE-300) INJECTION 61%
500.0000 mL | Freq: Once | INTRAVENOUS | Status: AC | PRN
  Administered 2023-07-18: 100 mL via INTRAVENOUS

## 2023-08-01 ENCOUNTER — Ambulatory Visit: Admitting: Pulmonary Disease

## 2023-08-01 ENCOUNTER — Encounter: Payer: Self-pay | Admitting: Pulmonary Disease

## 2023-08-01 VITALS — BP 124/82 | HR 68 | Ht 69.0 in | Wt 255.8 lb

## 2023-08-01 DIAGNOSIS — F17210 Nicotine dependence, cigarettes, uncomplicated: Secondary | ICD-10-CM | POA: Diagnosis not present

## 2023-08-01 DIAGNOSIS — R9389 Abnormal findings on diagnostic imaging of other specified body structures: Secondary | ICD-10-CM | POA: Diagnosis not present

## 2023-08-01 NOTE — Patient Instructions (Signed)
 Nothing to worry on CT scan  We discussed avoiding all smoking to help minimize any chance of emphysema on CT scan worsening  If you develop shortness of breath with exercise or at rest worsening, please contact us  and we can pursue additional testing  Return clinic as needed

## 2023-08-01 NOTE — Progress Notes (Signed)
 @Patient  ID: Veronica Pollard, female    DOB: 1970-07-27, 53 y.o.   MRN: [id number]  Chief Complaint  Patient presents with   Consult    Follow up on chest x-ray results    Referring provider: Arn Lane, MD  HPI:   53 y.o. woman whom we are seeing for evaluation of abnormal CT scan.  Multiple notes from PCP reviewed.  Patient was having what sound like sensations are nonspecific symptoms.  This prompted chest x-ray 02/2023.  On my review interpretation this reveals clear lungs bilaterally.  Ongoing symptoms with prompted CT chest.  This reveals mild emphysematous changes particularly in the upper lobes, diffuse mild bronchial wall thickening, area of discrete scarring mostly along spine, likely osteophytic scarring on my review and interpretation.  Report of enlarged PA on CT scan noted.  She has no dyspnea on exertion.  No shortness of breath at rest.  She exercises regularly.  Runs on the treadmill.  There is all sorts of cardio, weightlifting.  No limitations.  She does smoke about 3 cigarettes a day, sometimes less.  She also smokes marijuana.  We discussed smoking cessation counseling, tobacco and marijuana.  We discussed additional screening for possible pulmonary hypertension with echocardiogram.  She was congratulated on exercise and weight loss.  She is encouraged to do this.  She did abstain from cigarettes for period of time, she was encouraged that she could quit again.  Questionaires / Pulmonary Flowsheets:   ACT:      No data to display          MMRC:     No data to display          Epworth:      No data to display          Tests:   FENO:  No results found for: "NITRICOXIDE"  PFT:     No data to display          WALK:      No data to display          Imaging: CT ENTERO ABD/PELVIS W CONTAST Result Date: 07/19/2023 CLINICAL DATA:  Multiple colonic polyps.  Polyposis syndrome. EXAM: CT ABDOMEN AND PELVIS WITH CONTRAST (ENTEROGRAPHY)  TECHNIQUE: Multidetector CT of the abdomen and pelvis during bolus administration of intravenous contrast. Negative oral contrast was given. RADIATION DOSE REDUCTION: This exam was performed according to the departmental dose-optimization program which includes automated exposure control, adjustment of the mA and/or kV according to patient size and/or use of iterative reconstruction technique. CONTRAST:  100mL ISOVUE -300 IOPAMIDOL  (ISOVUE -300) INJECTION 61% COMPARISON:  11/24/2005 FINDINGS: Lower Chest: No acute findings. Hepatobiliary: No suspicious hepatic masses identified. Prior cholecystectomy. No evidence of biliary obstruction. Pancreas:  No mass or inflammatory changes. Spleen: Within normal limits in size and appearance. Adrenals/Urinary Tract: No suspicious masses identified. No evidence of ureteral calculi or hydronephrosis. Stomach/Bowel: Stomach is well distended by negative oral contrast and shows no evidence of intraluminal polyps or masses. Small bowel is suboptimally distended, but no enhancing polyps or masses are identified. Terminal ileum is normal appearance. No evidence of bowel wall thickening, abnormal contrast enhancement, or mesenteric inflammatory changes. No evidence of stricture or dilatation. Normal appendix visualized. Diverticulosis is seen predominately involving the ascending and transverse colon, without signs of diverticulitis. Vascular/Lymphatic: No pathologically enlarged lymph nodes. No acute vascular findings. Reproductive: Prior hysterectomy noted. Adnexal regions are unremarkable in appearance. Other:  None. Musculoskeletal: No suspicious bone lesions identified. Right hip prosthesis noted.  IMPRESSION: No CT evidence of gastrointestinal polyps or masses. No radiographic evidence of inflammatory bowel disease or other acute findings. Colonic diverticulosis, without radiographic evidence of diverticulitis. Electronically Signed   By: Marlyce Sine M.D.   On: 07/19/2023 13:26     Lab Results: No chest administration.  No orthopnea or PND.  Comprehensive review of systems otherwise negative. CBC    Component Value Date/Time   WBC 11.1 (H) 05/05/2023 0320   RBC 4.73 05/05/2023 0320   HGB 11.4 (L) 05/05/2023 0320   HGB 10.4 (L) 03/12/2023 0911   HCT 35.9 (L) 05/05/2023 0320   HCT 33.9 (L) 03/12/2023 0911   PLT 258 05/05/2023 0320   PLT 247 03/12/2023 0911   MCV 75.9 (L) 05/05/2023 0320   MCV 75 (L) 03/12/2023 0911   MCH 24.1 (L) 05/05/2023 0320   MCHC 31.8 05/05/2023 0320   RDW 17.4 (H) 05/05/2023 0320   RDW 20.3 (H) 03/12/2023 0911   LYMPHSABS 2.3 03/12/2023 0911   MONOABS 0.6 05/12/2010 2150   EOSABS 0.1 03/12/2023 0911   BASOSABS 0.1 03/12/2023 0911    BMET    Component Value Date/Time   NA 138 05/05/2023 0320   NA 144 03/12/2023 0911   K 3.0 (L) 05/05/2023 0320   CL 100 05/05/2023 0320   CO2 26 05/05/2023 0320   GLUCOSE 118 (H) 05/05/2023 0320   BUN 17 05/05/2023 0320   BUN 11 03/12/2023 0911   CREATININE 1.15 (H) 05/05/2023 0320   CALCIUM  9.2 05/05/2023 0320   GFRNONAA 57 (L) 05/05/2023 0320   GFRAA 87 04/23/2020 1146    BNP    Component Value Date/Time   BNP 19.4 05/07/2014 0920    ProBNP No results found for: "PROBNP"  Specialty Problems       Pulmonary Problems   Sleep apnea    Allergies  Allergen Reactions   Penicillins Shortness Of Breath and Swelling    Swollen throat, difficult breathing   Cefazolin  Itching    Itching, tongue swelling   Tramadol  Nausea And Vomiting    There is no immunization history for the selected administration types on file for this patient.  Past Medical History:  Diagnosis Date   Carpal tunnel syndrome    Complication of anesthesia    High cholesterol    Hypertension    IBS (irritable bowel syndrome)    Osteoarthritis    PONV (postoperative nausea and vomiting)    Renal disorder    early stage kidney disease - patient states not correct   Ruptured disc, cervical      Tobacco History: Social History   Tobacco Use  Smoking Status Light Smoker   Types: Cigarettes  Smokeless Tobacco Never  Tobacco Comments   3-4 cigarettes a day. 07/31/2021   Ready to quit: Not Answered Counseling given: Not Answered Tobacco comments: 3-4 cigarettes a day. 07/31/2021   Continue to not smoke  Outpatient Encounter Medications as of 08/01/2023  Medication Sig   rosuvastatin  (CRESTOR ) 20 MG tablet TAKE 1 TABLET(20 MG) BY MOUTH DAILY   telmisartan -hydrochlorothiazide (MICARDIS  HCT) 40-12.5 MG tablet TAKE 1 TABLET BY MOUTH DAILY   dicyclomine  (BENTYL ) 20 MG tablet Take 1 tablet (20 mg total) by mouth 3 (three) times daily as needed for spasms. (Patient not taking: Reported on 08/01/2023)   erythromycin  ophthalmic ointment Place a 1/2 inch ribbon of ointment into the lower eyelid of left eye BID for one week (Patient not taking: Reported on 08/01/2023)   ferrous sulfate  324 (65 Fe)  MG TBEC Take 1 tablet (325 mg total) by mouth every other day. (Patient not taking: Reported on 08/01/2023)   Fezolinetant  45 MG TABS Take 1 tablet (45 mg total) by mouth daily. (Patient not taking: Reported on 08/01/2023)   methocarbamol  (ROBAXIN -750) 750 MG tablet Take 1 tablet (750 mg total) by mouth 2 (two) times daily as needed for muscle spasms. (Patient not taking: Reported on 08/01/2023)   ondansetron  (ZOFRAN ) 4 MG tablet Take 1 tablet (4 mg total) by mouth every 8 (eight) hours as needed for nausea or vomiting. (Patient not taking: Reported on 08/01/2023)   No facility-administered encounter medications on file as of 08/01/2023.     Review of Systems  Review of Systems  No chest pain with exertion.  Orthopnea or PND.  Comprehensive review of systems otherwise negative. Physical Exam  BP 124/82 (BP Location: Left Arm, Patient Position: Sitting, Cuff Size: Normal)   Pulse 68   Ht 5\' 9"  (1.753 m)   Wt 255 lb 12.8 oz (116 kg)   SpO2 100%   BMI 37.78 kg/m   Wt Readings from Last 5  Encounters:  08/01/23 255 lb 12.8 oz (116 kg)  05/05/23 244 lb (110.7 kg)  05/04/23 244 lb 6.4 oz (110.9 kg)  03/27/23 248 lb (112.5 kg)  03/12/23 247 lb (112 kg)    BMI Readings from Last 5 Encounters:  08/01/23 37.78 kg/m  05/05/23 36.03 kg/m  05/04/23 35.07 kg/m  03/27/23 35.58 kg/m  03/12/23 36.48 kg/m     Physical Exam General: Sitting in chair, no acute distress Eyes: EOMI, no icterus Neck: Supple, no JVP Pulmonary: Clear, normal breathing Cardiac: Warm no edema Abdomen: Nondistended MSK: No synovitis, no joint effusion Neuro: Normal gait, no weakness Psych: Normal mood, full affect  Assessment & Plan:   Abnormal CT scan: With mild emphysematous changes, vasculopathy, mild increase in PA dimensions, small discrete area of scarring appears postinflammatory.  No significant concerning findings.  Discussed in detail.  Emphysema: Discussed in detail related to her smoking as well as marijuana smoking.  She is highly encouraged to strict abstinence from all forms of smoking.  She expressed understanding.  She has no symptoms of dyspnea to prompt further workup or inhaler therapy.  Enlarged pulmonary artery CT scan, chest symptoms of dyspnea.  No symptoms of coronary swelling.  No signs or symptoms of pulmonary hypertension.  We discussed possible further screening test and echocardiogram.  Given her lack of symptoms after shared decision-making we agreed for no further workup at this time.  Cannot reevaluate her symptoms particularly shortness of breath with exertion or at rest were to occur.  Smoking assessment and cessation counseling I have advised the patient to quit/stop smoking as soon as possible due to high risk for multiple medical problems.  It will also be very difficult for us  to manage patient's  respiratory symptoms and status if we continue to expose her lungs to a known irritant.  We do not advise e-cigarettes as a form of stopping smoking. Patient is  willing to quit smoking. I have advised the patient that we can assist and have options of nicotine replacement therapy, provided smoking cessation education today, provided smoking cessation counseling, and provided cessation resources. Follow-up next office visit office visit for assessment of smoking cessation.  I spent 30 minutes in smoking cessation counseling.   Return if symptoms worsen or fail to improve.   Guerry Leek, MD 08/01/2023

## 2023-08-08 ENCOUNTER — Telehealth: Payer: Self-pay

## 2023-08-08 ENCOUNTER — Ambulatory Visit (HOSPITAL_COMMUNITY)
Admission: RE | Admit: 2023-08-08 | Discharge: 2023-08-08 | Disposition: A | Discharge status: Home / Self Care | Source: Ambulatory Visit | Attending: Family Medicine | Admitting: Family Medicine

## 2023-08-08 ENCOUNTER — Ambulatory Visit (INDEPENDENT_AMBULATORY_CARE_PROVIDER_SITE_OTHER): Admitting: Family Medicine

## 2023-08-08 ENCOUNTER — Ambulatory Visit: Payer: Self-pay | Admitting: Family Medicine

## 2023-08-08 VITALS — BP 139/79 | HR 62 | Ht 69.0 in | Wt 260.8 lb

## 2023-08-08 DIAGNOSIS — M7989 Other specified soft tissue disorders: Secondary | ICD-10-CM | POA: Insufficient documentation

## 2023-08-08 DIAGNOSIS — Z713 Dietary counseling and surveillance: Secondary | ICD-10-CM

## 2023-08-08 NOTE — Progress Notes (Signed)
 DVT US  negative though did demonstrate incidental femoral artery calcifications on the right. Will collect ABIs for further evaluation. Feel swelling most likely venous stasis. If not improved with conservative measures, can consider echocardiogram to assess any CHF though reassuringly without SOB today.  Green team: please help get this scheduled for her when able. Thank you!

## 2023-08-08 NOTE — Progress Notes (Signed)
    SUBJECTIVE:   CHIEF COMPLAINT / HPI:   Bilateral leg swelling Left>right. Worsening over last 3 weeks. Worse at night time. She is on telmisartan -hydrochlorothiazide for her BP only. 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 Has not tried compression stockings. Mild pain in the dorsum but not in the leg or calf. No increased redness. No history of blood clots but her father has a history. No SOB.  Weight loss counseling Has IBS and would like something to help with weight loss. She used to be 400 pounds but then got down to 260 pounds. Consistently going to the gym but has not been able to lose any weight since being on steroids and having her hip replacement. Tries to count calories and eat less carbs and sugary foods.  PERTINENT  PMH / PSH: right hip replacement  OBJECTIVE:   BP 139/79   Pulse 62   Ht 5\' 9"  (1.753 m)   Wt 260 lb 12.8 oz (118.3 kg)   SpO2 100%   BMI 38.51 kg/m   General: Alert and oriented, in NAD Skin: Warm, dry, and intact; intermittent varicose veins most predominant on right lower extremity HEENT: NCAT, EOM grossly normal, midline nasal septum Respiratory: Breathing and speaking comfortably on RA Extremities: Moves all extremities grossly equally, bilateral lower extremities with trace pitting edema L>R without overlying excessive warmth or erythema; tenderness to palpation over palpation of left dorsum Neurological: No gross focal deficit, normal gait Psychiatric: Appropriate mood and affect    ASSESSMENT/PLAN:   Assessment & Plan Leg swelling Acute swelling of bilateral limbs, left greater than right.  Reassuringly without calf pain, excessive warmth, or erythema; however, because of the acute nature and pain to palpation over the dorsum of her left foot, we will collect stat DVT studies bilaterally today.  Most likely cause venous stasis.  Reassuringly without shortness of breath or focal pulmonary findings, though could consider echo in the  future as a cause. Encounter for weight loss counseling Congratulated on great lifestyle choices with diet and exercise.  She has seen a nutritionist.  Advised consistent caloric monitoring to remain in gentle caloric deficit.  Relative contraindication to GLP-1's given her history of IBS and stomach issues and phentermine/topiramate given her hypertension.    Veronica Kenning, MD [facility]

## 2023-08-08 NOTE — Assessment & Plan Note (Signed)
 Acute swelling of bilateral limbs, left greater than right.  Reassuringly without calf pain, excessive warmth, or erythema; however, because of the acute nature and pain to palpation over the dorsum of her left foot, we will collect stat DVT studies bilaterally today.  Most likely cause venous stasis.  Reassuringly without shortness of breath or focal pulmonary findings, though could consider echo in the future as a cause.

## 2023-08-08 NOTE — Telephone Encounter (Signed)
 Attempted to call Centralized Scheduling and spoke with Perla Bradford. Dorothy transferred me to the Heart and Vascular department. Hear and Vascular department transferred me to speak with [facility]. While patient was in office, Spoke with Felicia and was able to schedule an DTV appointment for today right after appointment with Dr. Fredrik Jensen.   Location [address] , 4th floor (Heart and Vascular Center).  Christ Courier, CMA

## 2023-08-08 NOTE — Patient Instructions (Addendum)
 You need 2050 calories per day to maintain your current weight. I recommend trying to take in 1800 calories per day. This can help you stay in a gentle calorie deficit and help you lose weight with continued exercise.  For your leg swelling, I have sent in an ultrasound to ensure you do not have a blood clot. This is likely venous stasis. Losing weight could also help with this.

## 2023-08-09 ENCOUNTER — Telehealth: Payer: Self-pay

## 2023-08-09 NOTE — Telephone Encounter (Signed)
 Called [facility] and spoke with Shelagh Derrick to schedule an US  appointment for patient. Was able to schedule patient for Tuesday, 5/27 at 12:40pm. Information that patient needs to know. The location will be at [address], and if patient need to reschedule or cancel call their office at [phone] to avoid a $75 fee.  Attempted to call patient twice, was able to speak with patient the second time to inform her about her upcoming appointment on 5/27 at 12:40pm. Patient interrupted and stated that she need to know her results from her prior US  before she goes to the US  that is scheduled. She stated that Dr. Fredrik Jensen is suppose to call her and tell her about scheduling another US  appointment as well as tell her about results from prior appointment. Did read to her the MyChart message that Dr. Fredrik Jensen wrote to her. She stated that she wanted Dr. Fredrik Jensen to contact her because he is the Doctor and not for one of the "little" nurses to call her.  Did inform patient that I will send Dr. Fredrik Jensen a message to call her.  Wrote Dr. Fredrik Jensen in a staff message to call patient.  Christ Courier, CMA

## 2023-08-10 ENCOUNTER — Inpatient Hospital Stay

## 2023-08-10 ENCOUNTER — Encounter: Payer: Self-pay | Admitting: Genetic Counselor

## 2023-08-10 ENCOUNTER — Inpatient Hospital Stay: Attending: Genetic Counselor | Admitting: Genetic Counselor

## 2023-08-10 DIAGNOSIS — Z860100 Personal history of colon polyps, unspecified: Secondary | ICD-10-CM

## 2023-08-10 DIAGNOSIS — Z803 Family history of malignant neoplasm of breast: Secondary | ICD-10-CM

## 2023-08-10 LAB — GENETIC SCREENING ORDER

## 2023-08-10 NOTE — Progress Notes (Signed)
 REFERRING PROVIDER: Felecia Hopper, MD   PRIMARY PROVIDER:  Limmie Ren, MD  PRIMARY REASON FOR VISIT:  1. History of colonic polyps   2. Family history of malignant neoplasm of breast    HISTORY OF PRESENT ILLNESS:   Veronica Pollard, a 53 y.o. female, was seen for a  cancer genetics consultation at the request of Brahmbhatt, Parag, MD due to a personal history of colon polyps and family history of breast cancer.  Veronica Pollard presents to clinic today to discuss the possibility of a hereditary predisposition to cancer, genetic testing, and to further clarify her future cancer risks, as well as potential cancer risks for family members.   Veronica Pollard had a colonoscopy 05/02/23 that identified 14 adenomatous polyps, including multiple large tubulovillous adenomas. Six month follow up colonoscopy has been recommended.   RELEVANT MEDICAL HISTORY:  Menarche was at age 61.  Nulliparous.  Ovaries intact: yes.  Hysterectomy: yes.  Menopausal status: postmenopausal.  HRT use: 0 years. Colonoscopy: yes; abnormal. Mammogram within the last year: yes. Number of breast biopsies: 1.   Past Medical History:  Diagnosis Date   Carpal tunnel syndrome    Complication of anesthesia    High cholesterol    Hypertension    IBS (irritable bowel syndrome)    Osteoarthritis    PONV (postoperative nausea and vomiting)    Renal disorder    early stage kidney disease - patient states not correct   Ruptured disc, cervical     Past Surgical History:  Procedure Laterality Date   ABDOMINAL HYSTERECTOMY     BREAST BIOPSY Right 2014   benign   CARPAL TUNNEL RELEASE     CHOLECYSTECTOMY     COLONOSCOPY     HAMMER TOE SURGERY     x 3   TOTAL HIP ARTHROPLASTY Right 07/24/2022   Procedure: RIGHT TOTAL HIP ARTHROPLASTY ANTERIOR APPROACH;  Surgeon: Wes Hamman, MD;  Location: [facility] OR;  Service: Orthopedics;  Laterality: Right;  3-C    Social History   Socioeconomic History   Marital status:  Married    Spouse name: Royston Cornea   Number of children: 1   Years of education: Not on file   Highest education level: Not on file  Occupational History   Not on file  Tobacco Use   Smoking status: Light Smoker    Types: Cigarettes   Smokeless tobacco: Never   Tobacco comments:    3-4 cigarettes a day. 07/31/2021  Vaping Use   Vaping status: Never Used  Substance and Sexual Activity   Alcohol use: Yes    Comment: socially   Drug use: No   Sexual activity: Not Currently  Other Topics Concern   Not on file  Social History Narrative   1 adopted son   Social Drivers of Corporate investment banker Strain: Not on file  Food Insecurity: Not on file  Transportation Needs: Not on file  Physical Activity: Not on file  Stress: Not on file  Social Connections: Not on file     FAMILY HISTORY:  We obtained a detailed, 4-generation family history.  Significant diagnoses are listed below: Family History  Problem Relation Age of Onset   Breast cancer Mother 52   Diabetes Mother    High blood pressure Mother    High Cholesterol Mother    Clotting disorder Father    Heart disease Father    Diabetes Father    Early death Father    High Cholesterol Father  High blood pressure Father    Leukemia Father    Alcohol abuse Sister    Depression Sister    High blood pressure Sister    Drug abuse Sister    Breast cancer Sister 2 - 37   Throat cancer Sister 45   Cancer Maternal Aunt 38 - 49       unknown pathology   Cancer Maternal Uncle 48 - 59       unknown pathology   Cancer Maternal Uncle 27 - 88       unknown pathology    Veronica Pollard is unaware of previous family history of genetic testing for hereditary cancer risks. There is no reported Ashkenazi Jewish ancestry.   Veronica Pollard reports her sister was diagnosed with breast cancer in her 51s, throat cancer at 84 (history of cigarette and alcohol use) and a history of multiple colon polyps. Her sister is living at age 46 and may  have had genetic testing, results unknown. Veronica Pollard reports her father was diagnosed with leukemia and passed away at 24. Veronica Pollard reports her mother was diagnosed with breast cancer at 42, living at 74. She reports a maternal aunt and two maternal uncles diagnosed with unknown cancers in their 47s-50s. She reports a maternal great-aunt diagnosed with cancer, age of diagnosis unknown.     GENETIC COUNSELING ASSESSMENT: Veronica Pollard is a 53 y.o. female with a family history of polyps and family history of cancer which is suggestive of an inherited predisposition to cancer given her personal history of more than 10 colonic adenomas and a first degree relative with breast cancer under 50. We, therefore, discussed and recommended the following at today's visit.   DISCUSSION: We discussed that, in general, most cancer is not inherited in families, but instead is sporadic or familial. Sporadic cancers occur by chance and typically happen at older ages (>50 years) as this type of cancer is caused by genetic changes acquired during an individual's lifetime. Some families have more cancers than would be expected by chance; however, the ages or types of cancer are not consistent with a known genetic mutation or known genetic mutations have been ruled out. This type of familial cancer is thought to be due to a combination of multiple genetic, environmental, hormonal, and lifestyle factors. While this combination of factors likely increases the risk of cancer, the exact source of this risk is not currently identifiable or testable.  We discussed that 5 - 10% of cancer is hereditary. We discussed that testing is beneficial for several reasons including identifying surveillance options, preventative treatments, and to understand if other family members could be at risk for cancer and allow them to undergo genetic testing.   We reviewed the characteristics, features and inheritance patterns of hereditary cancer  syndromes. We also discussed genetic testing, including the appropriate family members to test, the process of testing, insurance coverage and turn-around-time for results. We discussed the implications of a negative, positive, carrier and/or variant of uncertain significant result. Veronica Pollard  was offered a common hereditary cancer panel (36+ genes) and an expanded pan-cancer panel (70+ genes). Veronica Pollard was informed of the benefits and limitations of each panel, including that expanded pan-cancer panels contain genes that do not have clear management guidelines at this point in time.  We also discussed that as the number of genes included on a panel increases, the chances of variants of uncertain significance increases. Veronica Pollard decided to pursue genetic testing for the 29 gene panel.  The CancerNext-Expanded gene panel offered by [facility] and includes sequencing, rearrangement, and RNA analysis for the following 78 genes: AIP, ALK, APC, ATM, AXIN2, BAP1, BARD1, BMPR1A, BRCA1, BRCA2, BRIP1, CDC73, CDH1, CDK4, CDKN1B, CDKN2A, CEBPA, CHEK2, CTNNA1, DDX41, DICER1, EGFR, EPCAM, ETV6, FH, FLCN, GATA2, GREM1, HOXB13, KIT, LZTR1, MAX, MBD4, MEN1, MET, MITF, MLH1, MSH2, MSH3, MSH6, MUTYH, NF1, NF2, NTHL1, PALB2, PDGFRA, PHOX2B, PMS2, POLD1, POLE, POT1, PRKAR1A, PTCH1m PTEN, RAD51C, RAD51D, RB1, RET, RPS20, RUNX1, SDHA, SDHAF2, SDHB, SDHC, SDHD, SMAD4, SMARCA4, SMARCB1, SMARCE1, STK11, SUFU, TMEM127, TP53, TSC1, TSC2, VHL, WT1. We discussed adding on the preliminary evidence gene, MLH3, due to her personal history of colon polyps.   Based on Veronica Pollard's personal history of colon cancer and family history cancer, she meets medical criteria for genetic testing. Despite that she meets criteria, she may still have an out of pocket cost. We discussed that if her out of pocket cost for testing is over $100, the laboratory will call and confirm whether she wants to proceed with testing.  If the out of pocket cost of  testing is less than $100 she will be billed by the genetic testing laboratory.   We discussed that some people do not want to undergo genetic testing due to fear of genetic discrimination.  The Genetic Information Nondiscrimination Act (GINA) was signed into federal law in 2008. GINA prohibits health insurers and most employers from discriminating against individuals based on genetic information (including the results of genetic tests and family history information). According to GINA, health insurance companies cannot consider genetic information to be a preexisting condition, nor can they use it to make decisions regarding coverage or rates. GINA also makes it illegal for most employers to use genetic information in making decisions about hiring, firing, promotion, or terms of employment. It is important to note that GINA does not offer protections for life insurance, disability insurance, or long-term care insurance. GINA does not apply to those in the [employer], those who work for companies with less than 15 employees, and new life insurance or long-term disability insurance policies.  Health status due to a cancer diagnosis is not protected under GINA. More information about GINA can be found by visiting [email or web address].  The Tyrer-Cuzick model is one of multiple prediction models developed to estimate an individual's lifetime risk of developing breast cancer. The Tyrer-Cuzick model is endorsed by the [employer] ([employer]). This model includes many risk factors such as family history, endogenous estrogen exposure, and benign breast disease. The calculation is highly-dependent on the accuracy of clinical data provided by the patient and can change over time. The Tyrer-Cuzick model may be repeated to reflect new information in her personal or family history in the future. We will run this model and discuss with return of results.   PLAN: After considering the risks, benefits, and  limitations, Veronica Pollard provided informed consent to pursue genetic testing and the blood sample was sent to [facility] for analysis of the CustomNext-Cancer +RNAinsight panel. Results should be available within approximately 2-3 weeks' time, at which point they will be disclosed by telephone to Veronica Pollard, as will any additional recommendations warranted by these results. Veronica Pollard will receive a summary of her genetic counseling visit and a copy of her results once available. This information will also be available in Epic.   Based on Veronica Pollard's family history, we recommended her sister, who was diagnosed with breast in her 67s, have genetic counseling and testing. Veronica Pollard  will let us  know if we can be of any assistance in coordinating genetic counseling and/or testing for this family member.   Lastly, we encouraged Veronica Pollard to remain in contact with cancer genetics annually so that we can continuously update the family history and inform her of any changes in cancer genetics and testing that may be of benefit for this family.   Ms. Fournier questions were answered to her satisfaction today. Our contact information was provided should additional questions or concerns arise. Thank you for the referral and allowing us  to share in the care of your patient.   Jobie Mulders, MS, [facility] Licensed, Retail [email or web address] phone: [phone]  60 minutes were spent on the date of the encounter in service to the patient including preparation, face-to-face consultation, documentation and care coordination.  The patient was seen alone. Drs. Johnna Nakai, and/or Gudena were available for questions, if needed..   _________________________________________________________________ For Office Staff:  Number of people involved in session: 1 Was an Intern/ student involved with case: yes. [employer] Genetic Counseling Intern, Moira Andrews was present for this visit.

## 2023-08-14 ENCOUNTER — Other Ambulatory Visit

## 2023-08-14 NOTE — Telephone Encounter (Signed)
 Patient returns call to nurse line. Patient is adamant that no one informed her of today's appointment for ABI's.   She states that she did not consent to this appointment and that she should not be obligated to keep this appointment due to her having another appointment today. Attempted to advise of information per previous note, patient begins to state again that she was not informed and that we cannot make her appointments without her consent and that this is "breaking HIPAA."   Patient is asking that I call to [facility] imaging to reschedule this appointment. Offered to cancel appointment and have patient call and reschedule given the confusion around the appointment. Patient states that if our office does not schedule the appointment, she will not go.   Called and spoke with scheduler at [facility] Advised of situation and they will make notation to hopefully prevent $75 fee. Rescheduled appointment for next Monday, 08/20/23 at 8:00 per patient request.   Called patient and provided with updated appointment information. She is also going to call [facility] regarding no-show/cancellation fee.   Elsie Halo, RN

## 2023-08-20 ENCOUNTER — Other Ambulatory Visit

## 2023-08-28 ENCOUNTER — Encounter: Payer: Self-pay | Admitting: *Deleted

## 2023-08-30 ENCOUNTER — Encounter: Payer: Self-pay | Admitting: Genetic Counselor

## 2023-08-30 ENCOUNTER — Ambulatory Visit: Payer: Self-pay | Admitting: Genetic Counselor

## 2023-08-30 ENCOUNTER — Telehealth: Payer: Self-pay | Admitting: Genetic Counselor

## 2023-08-30 DIAGNOSIS — Z1509 Genetic susceptibility to other malignant neoplasm: Secondary | ICD-10-CM | POA: Insufficient documentation

## 2023-08-30 NOTE — Progress Notes (Addendum)
 GENETIC TEST RESULTS   Patient Name: Veronica Pollard Encounter Date: 08/30/23    Referring Provider: Felecia Hopper, MD   Veronica Pollard  was seen in the Cancer Genetics clinic on 08/10/23 due to a personal history of more than 10 adenomatous polyps  and concern regarding a hereditary predisposition to cancer in the family. Please refer to the prior Genetics clinic note for more information regarding Veronica Pollard medical and family histories and our assessment at the time.    FAMILY HISTORY:  We obtained a detailed, 4-generation family history.  Significant diagnoses are listed below: Family History  Problem Relation Age of Onset   Breast cancer Mother 36   Diabetes Mother    High blood pressure Mother    High Cholesterol Mother    Clotting disorder Father    Heart disease Father    Diabetes Father    Early death Father    High Cholesterol Father    High blood pressure Father    Leukemia Father    Alcohol abuse Sister    Depression Sister    High blood pressure Sister    Drug abuse Sister    Breast cancer Sister 54 - 57   Throat cancer Sister 60   Cancer Maternal Aunt 35 - 49       unknown pathology   Cancer Maternal Uncle 25 - 21       unknown pathology   Cancer Maternal Uncle 68 - 53       unknown pathology     Veronica Pollard is unaware of previous family history of genetic testing for hereditary cancer risks. There is no reported Ashkenazi Jewish ancestry.    Veronica Pollard reports her sister was diagnosed with breast cancer in her 40s, throat cancer at 29 (history of cigarette and alcohol use) and a history of multiple colon polyps. Her sister is living at age 89 and may have had genetic testing, results unknown. Veronica Pollard reports her father was diagnosed with leukemia and passed away at 78. Veronica Pollard reports her mother was diagnosed with breast cancer at 6, living at 51. She reports a maternal aunt and two maternal uncles diagnosed with unknown cancers in their 28s-50s. She  reports a maternal great-aunt diagnosed with cancer, age of diagnosis unknown.       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    The test report will be scanned into EPIC and is located under the Molecular Pathology section of the Results Review tab.  A portion of the result report is included below for reference. Genetic testing reported out on 08/28/23.     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  At this time, it is unknown if this variant is associated with increased cancer risk or if this is a normal finding, but most variants such as this get reclassified to being inconsequential. It should not be used to make medical management decisions. With time, we suspect the lab will determine the significance of this variant, if any. If we do learn more about it, we will try to contact Veronica Pollard to discuss it further. However, it is important to stay in touch with us  periodically and keep the address and phone number up to date.   CLINICAL INFORMATION  Familial adenomatous polyposis (FAP) is a genetic  condition caused by pathogenic or likely pathogenic variants in the APC gene. FAP causes hundreds to thousands of polyps to develop in the colon or other parts of the digestive tract. The polyps typically associated with FAP are called adenomas, or precancerous polyps. On average, individuals with FAP develop polyps at 54 years of age, with 95% of individuals developing polyps by age 41. Some individuals with FAP might have a milder form of the condition, called attenuated FAP (AFAP). People with AFAP typically have fewer than 100 adenomatous polyps, which may develop at later ages than classic FAP (onset approximately 54 years of age), and polyps that are primarily  proximal (right-sided). Without regular screening and removal of polyps, the lifetime risk of colon cancer is up to 70-100%.  The determination on whether an APC variant is associated with FAP or AFAP is made based on genotype/phenotype correlations, personal and family history. The location of the mutation within the APC gene can affect whether a person has FAP or AFAP, and therefore how likely it is for them to develop certain FAP features. [name] history is most consistent with AFAP given her personal history of 13 adenomas identified at age 38. Additionally, the c.147_150delACAA (p.K49Nfs*20) variant falls in exon 2. Alterations that result in premature termination in coding exon 2 are associated with an attenuated phenotype and may have reduced penetrance compared to classic familial adenomatous polyposis syndrome.  FAP also increases the risk of other cancers including stomach, thyroid , brain, pancreas, small bowel, specifically in the duodenum and periampulla. The duodenum is the first part of the small intestine right after the stomach. Periampullary cancer involves other structures near the duodenum. It can also increase the risk of a childhood liver cancer called hepatoblastoma and non-cancerous tumors connective tissue called desmoids.     See chart below for an estimation of current risks per NCCN v4.2024. Risks and recommendations may change as we learn more about APC mutations.   Cancer Type  FAP Lifetime Cancer Risk General Population Lifetime Risk  Colon  Up to 100% (classic FAP); ~70% (attenuated FAP) without treatment  4.1%  Duodenal or periampullary  <1-10% -   Gastric (stomach) 0.1-7.1% 0.8%  Small bowel <1% 0.3%  Thyroid  (typically papillary thyroid  carcinoma)   1.2-12% 1.2%  Brain/CNS (typically medulloblastoma) 1% 0.6%  Liver (hepatoblastoma) 0.4-2.5% (rare after 54 years of age) -  Intra-abdominal desmoid tumors 10-24% -   People with FAP may also develop extra colonic  findings. Such as polyps in the stomach and small bowel, extra or missing teeth, osteomas, epidermoid cysts, fibromas, and congenital hyperplasia of the retinal pigment epithelium (CHRPE), which is a specific finding that requires a specialized eye examination to identify.    Those with AFAP are less likely to have extra-colonic manifestations related to their APC mutation.    Management Medical management and surveillance protocols have been developed by the [facility] (NCCN) for individuals with FAP and AFAP (National Comprehensive Cancer Network, Genetic/Familial High Risk Assessment: Colorectal, Endometrial, Gastric version 4.2024).  For individuals with classic FAP  Colon: Annual sigmoidoscopy/colonoscopy beginning at 57-17 years of age. A colectomy or proctocolectomy is recommended after numerous polyps are detected, due to their high malignant potential.  If patient had colectomy with ileorectal anastomosis (IRA), then endoscopic evaluation of the rectum every 6-12 mo depending on polyp burden. If patient had TPC with ileal pouch-anal anastomosis (IPAA), then endoscopic evaluations of the ileal pouch and rectal cuff annually depending on polyp burden. Surveillance frequency  should be shortened to every 6 mo for large, flat polyps with villous histology and/or high-grade dysplasia identified. If patient had an ileostomy, consider careful visualization and stoma inspection by ileoscopy to evaluate for polyps or malignancy annually; evidence to support this recommendation is limited. Chemoprevention (e.g., sulindac) can aid in management of the remaining rectum; however, there are no medications currently approved by the FDA for this indication.  There are data to suggest that sulindac showed the most significant polyp regression, but it is unclear if the decrease in polyp burden equates to reduction in colorectal cancer risk. Extracolonic: Upper endoscopy beginning at  age 32-25 years. Consider upper endoscopy at an earlier age if colectomy is performed prior to age 48 years.  Consider baseline upper endoscopy earlier, if family history of aggressive duodenal adenoma burden or cancer It is important to note that fundic gland polyps are common in individuals with FAP and while focal low-grade dysplasia can be identified, it is typically non-progressive. High-risk histologic features include tubular adenomas, polyps with high-grade dysplasia, and pyloric gland adenomas. Need for specialized surveillance or surgery should be considered in presence of high-risk histologic features, preferably at a center of expertise. Presence of fundic gland polyps with low-grade dysplasia alone in the absence of high-risk features does not require specialized surveillance. Non-fundic gland polyps should be managed endoscopically if possible. Annual thyroid  examination beginning in the late teenage years.   If normal, consider repeating ultrasound every 2-5 y and if abnormal, consider referral to a thyroid  specialist. Shorter intervals may be considered for individuals with a family history of thyroid  cancer. Patients should be educated regarding signs and symptoms of neurologic cancer and the importance of prompt reporting of abnormal symptoms to their physicians. Suggestive abdominal symptoms should prompt immediate abdominal imaging. If personal history of symptomatic desmoids, consider imaging with abdominal MRI with and without contrast or CT with contrast no less frequently than annually.  For small bowel polyps and cancer,  may consider small bowel visualization (eg, capsule endoscopy), especially if advanced duodenal polyposis. Consider liver palpation, abdominal ultrasound, and measurement of AFP every 3-6 months during the first 5 years of life.  For individuals with attenuated FAP or AFAP Colon: Colonoscopy beginning in the late teens, then every 1-2 years. If polyp burden is  too great, consider colectomy with ileorectal anastomosis (IRA) or proctocolectomy with ileal pouch-anal anastomosis (IPAA) if rectal polyposis is not manageable with polypectomy. Following colectomy with IRA, endoscopic evaluation of the remaining rectum is recommended every 6-12 months depending on polyp burden.  Chemoprevention (e.g., sulindac) can aid in management of the remaining rectum; however, there are no medications currently approved by the FDA for this indication.  There are data to suggest that sulindac showed the most significant polyp regression, but it is unclear if the decrease in polyp burden equates to reduction in colorectal cancer risk. Extracolonic: Annual physical examination. Thyroid  ultrasound at baseline starting in late teenage years. If normal, consider repeating ultrasound every 2-5 y and if abnormal, consider referral to a thyroid  specialist. Shorter intervals may be considered for individuals with a family history of thyroid  cancer. Upper endoscopy with side-viewing examination beginning at age 50-25 years. Consider upper endoscopy at an earlier age if colectomy is performed prior to age 39 years. Frequency of upper endoscopy surveillance is dependent on polyp burden.   This information is based on current understanding of the gene and may change in the future.   An individual's cancer risk and medical management are not  determined by genetic test results alone. Overall cancer risk assessment incorporates additional factors, including personal medical history, family history, and any available genetic information that may result in a personalized plan for cancer prevention and surveillance.   Implications for Family Members: Cancer risks associated with FAP are inherited in an autosomal dominant manner. This means that children, siblings and parents of individuals with an APC variant have a 50% (1 in 2) chance of having the variant as well. Both males and females can  inherit an APC variant and can pass it on to their children. It is recommended that your family members have genetic testing to determine if they have the APC mutation and a higher risk for cancer. This could change the recommendations for their healthcare.   Most people with FAP inherit the condition from one of their parents who has a mutation in the APC gene. However, about 20-30% of the time, an individual with FAP is the first person in the family to have the condition (de novo). If the mutation is new, there is a lower chance for siblings to have the mutation. Children would still have a 50% chance to have the mutation.   It is recommended that children have genetic testing by age 34-15 per NCCN, when colon screening would be initiated. However, if families plan to complete hepatoblastoma screening, then FAP testing can be considered in infancy.   For individuals with an identified gene mutation, reproductive options are available including testing on-going pregnancies, testing children when they are of age, or IVF with the option to test embryos for mutations already found in the family. These options may be explored in greater detail with a reproductive or prenatal genetic counselor for more details.    Resources: FORCE (Facing Our Risk of Cancer Empowered) is a resource for those with a hereditary predisposition to develop cancer.  FORCE provides information about risk reduction, advocacy, legislation, and clinical trials.  Additionally, FORCE provides a platform for collaboration and support; which includes: peer navigation, message boards, local support groups, a toll-free helpline, research registry and recruitment, advocate training, published medical research, webinars, brochures, mastectomy photos, and more.  For more information, visit www.facingourrisk.org   FAP Voice: www.fapvoice.com  Breast Cancer Risk Assessment: Ms. Sneath has been determined to be at high risk for breast cancer.  her Tyrer-Cuzick risk score is 23.4%.  For women with a greater than 20% lifetime risk of breast cancer, the [employer] (NCCN) recommends the following:   Clinical encounter every 6-12 months to begin when identified as being at increased risk, but not before age 85  Annual mammograms. Tomosynthesis is recommended starting 10 years earlier than the youngest breast cancer diagnosis in the family or at age 87 (whichever comes first), but not before age 55   Annual breast MRI starting 10 years earlier than the youngest breast cancer diagnosis in the family or at age 42 (whichever comes first), but not before age 41.    We, therefore, discussed that it is reasonable for Ms. Setzer to be followed by a high-risk breast cancer clinic; in addition to a yearly mammogram and physical exam by a healthcare provider, she should discuss the usefulness of an annual breast MRI with the high-risk clinic providers.    PLAN:  Follow up with GI provider, Dr. Veronda Goody, to discuss any changes to endoscopic surveillance.   Follow up with PCP to discuss thyroid  cancer screening with thyroid  ultrasound and to complete annual physicals.   Referral to  the High Risk Breast Cancer Clinic to discuss additional screening and preventative options for breast cancer based on family history and elevated risk.   Share results and family letter with relatives to encourage genetic testing.   Consider in person genetic counseling to further review this result.    We encouraged Ms. Chinchilla to remain in contact with us  on an annual basis so we can update her personal and family histories, and let her know of advances in cancer genetics that may benefit the family. Our contact number was provided. Ms. Hendrie questions were answered to her satisfaction today, and she knows she is welcome to call anytime with additional questions.      Jobie Mulders, MS, [facility] Licensed, Radio [email or web address] phone: [phone]    This encounter included 20 minutes of genetic counseling by phone.

## 2023-08-30 NOTE — Telephone Encounter (Signed)
 I spoke to Ms. Oblinger to review results of genetic testing. Genetic testing completed with [facility] CustomNext-Expanded +RNAinsight panel. Testing did identify a pathogenic variant in APC, c.147_150delACAA.  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 We reviewed associated risks, management recommendations, risk to relatives, reproductive risks, and support resources. Elevated TC, will refer to High Risk Clinic. Patient declined in person results discussion, reviewed by phone. Please see counseling note for further detail on this result. Portion of result included below.

## 2023-09-04 ENCOUNTER — Ambulatory Visit: Admitting: Student

## 2023-09-04 ENCOUNTER — Encounter: Payer: Self-pay | Admitting: Student

## 2023-09-04 ENCOUNTER — Other Ambulatory Visit: Payer: Self-pay | Admitting: Student

## 2023-09-04 VITALS — BP 125/89 | HR 62 | Ht 69.0 in | Wt 257.0 lb

## 2023-09-04 DIAGNOSIS — I10 Essential (primary) hypertension: Secondary | ICD-10-CM

## 2023-09-04 DIAGNOSIS — Z1509 Genetic susceptibility to other malignant neoplasm: Secondary | ICD-10-CM

## 2023-09-04 DIAGNOSIS — Z1589 Genetic susceptibility to other disease: Secondary | ICD-10-CM

## 2023-09-04 DIAGNOSIS — R61 Generalized hyperhidrosis: Secondary | ICD-10-CM

## 2023-09-04 DIAGNOSIS — I739 Peripheral vascular disease, unspecified: Secondary | ICD-10-CM

## 2023-09-04 DIAGNOSIS — K582 Mixed irritable bowel syndrome: Secondary | ICD-10-CM

## 2023-09-04 DIAGNOSIS — E7800 Pure hypercholesterolemia, unspecified: Secondary | ICD-10-CM

## 2023-09-04 DIAGNOSIS — Z6837 Body mass index (BMI) 37.0-37.9, adult: Secondary | ICD-10-CM

## 2023-09-04 MED ORDER — WEGOVY 0.25 MG/0.5ML ~~LOC~~ SOAJ: 0.2500 mg | SUBCUTANEOUS | 1 refills | Status: DC

## 2023-09-04 MED ORDER — ONDANSETRON 4 MG PO TBDP
4.0000 mg | ORAL_TABLET | Freq: Three times a day (TID) | ORAL | 0 refills | Status: AC | PRN
Start: 2023-09-04 — End: ?

## 2023-09-04 NOTE — Assessment & Plan Note (Signed)
 Has failed diet and lifestyle interventions.  Willing to trial a GLP-1 agonist.  Long discussion today about the risks of this mimicking or exacerbating her IBS symptoms and she is willing to proceed.  Also discussed thyroid  cancer risk.  The proposed risk of GLP-1's is specific to medullary thyroid  cancers which is categorically different than the papillary thyroid  cancers that her APC mutation puts her at risk for, therefore would not consider this a contraindication. -Start Wegovy 0.25 mg weekly for 1 month, then increase to 0.5 mg weekly

## 2023-09-04 NOTE — Assessment & Plan Note (Signed)
 Will need every other year thyroid  ultrasounds given risk of papillary thyroid  cancers with this mutation. -Thyroid  ultrasound -Will concomitantly check TSH given recent history of night sweats, though those symptoms do seem to be improving rather than getting worse

## 2023-09-04 NOTE — Patient Instructions (Addendum)
 I recommend you buy some compression stockings for your legs. We also need to get your ABIs scheduled. We may be able to get this done the same day as your thyroid  ultrasound at [facility]. Graduated compression stockings with an ankle pressure of 20-30 mm Hg (class 2).  We will also try to start your [facility] for weight loss. Know that this *can* cause symptoms similar to your IBS. I am refilling some Zofran  for nausea.   Please call [facility] to schedule your ultrasounds.   Alexa Andrews, MD

## 2023-09-04 NOTE — Progress Notes (Signed)
 SUBJECTIVE:   CHIEF COMPLAINT / HPI:   Familial Adenomatous Polyposis  Patient was recently diagnosed with a pathogenic variant of the APC gene based on abnormal colonoscopy findings and subsequent genetic testing.  She was sent back to us  as her PCP by genetics to initiate biannual thyroid  ultrasound monitoring given the increased risk of papillary thyroid  cancers along patient's with his gene mutation.  She is not having any neck/thyroid  symptoms or symptoms of hyper/hypothyroidism, though do note that she was recently worked up for night sweats which seem to have improved without intervention.  Weight Loss Efforts Has been trying to lose weight for quite some time.  Tells me that she is frequently in the gym and working out.  Is frustrated that her weight is actually up about 10 pounds from this time last year.  Has been trying to cut out simple carbohydrates.  She would like to start a GLP-1 agonist agent.  She previously discussed this with Dr. Fredrik Jensen and they came to the shared decision to not initiate at that time given her concomitant IBS.  It sounds like she does occasionally have IBS flares that require her to present to the ER.  Though as she tells it, she very often gets a dose of Zofran  in triage and then leaves before being seen as the Zofran  adequately addresses her symptoms.  She has been thinking about this and feels confident that she would like to try [facility] understanding that it runs the risk of mimicking/worsening her GI symptoms.  LLE Edema Present for about a year. Previously seen by Dr. Fredrik Jensen for the same. Had a negative DVT US , though ultrasound did show incidental finding of calcification of the femoral artery.  No chest pain, shortness of breath, dyspnea on exertion.  Symptoms are felt to be most consistent with chronic venous stasis.  She has tried compression stockings in the past but found that they cut off her circulation.  OBJECTIVE:   BP 125/89   Pulse 62   Ht  5' 9 (1.753 m)   Wt 257 lb (116.6 kg)   SpO2 100%   BMI 37.95 kg/m   General: alert & oriented, no apparent distress, well groomed HEENT: normocephalic, atraumatic, EOM grossly intact, oral mucosa moist, neck supple Respiratory: normal respiratory effort GI: non-distended Skin: no rashes, no jaundice Psych: appropriate mood and affect Ext: LLE with generalized non-pitting edema with some pitting pedal edema. - Stemmer's sign. 2+ PT and DP pulses. Neg calf squeeze.   ASSESSMENT/PLAN:   Assessment & Plan Monoallelic mutation of APC gene Will need every other year thyroid  ultrasounds given risk of papillary thyroid  cancers with this mutation. -Thyroid  ultrasound -Will concomitantly check TSH given recent history of night sweats, though those symptoms do seem to be improving rather than getting worse BMI 37.0-37.9, adult Has failed diet and lifestyle interventions.  Willing to trial a GLP-1 agonist.  Long discussion today about the risks of this mimicking or exacerbating her IBS symptoms and she is willing to proceed.  Also discussed thyroid  cancer risk.  The proposed risk of GLP-1's is specific to medullary thyroid  cancers which is categorically different than the papillary thyroid  cancers that her APC mutation puts her at risk for, therefore would not consider this a contraindication. -Start Wegovy 0.25 mg weekly for 1 month, then increase to 0.5 mg weekly PAD (peripheral artery disease) (HCC) Diagnosis is based on the incidental finding of femoral artery disease on DVT ultrasound for lower extremity edema.  ABIs  have been ordered but not yet scheduled (there was some confusion around scheduling of these previously). -Lipid panel, if she has a greater than 10% risk of cardiac event in the next 10 years I would consider addition of aspirin  and aggressive lipid-lowering therapy -She will call [facility] to schedule ABIs -Discussed use of graduated compression stockings for chronic  venous insufficiency. If she had circulatory compromise with these previously, would size up.      Alexa Andrews, MD [facility]

## 2023-09-05 LAB — LIPID PANEL
Chol/HDL Ratio: 3 ratio (ref 0.0–4.4)
Cholesterol, Total: 133 mg/dL (ref 100–199)
HDL: 44 mg/dL (ref >39)
LDL Chol Calc (NIH): 71 mg/dL (ref 0–99)
Triglycerides: 93 mg/dL (ref 0–149)
VLDL Cholesterol Cal: 18 mg/dL (ref 5–40)

## 2023-09-05 LAB — TSH RFX ON ABNORMAL TO FREE T4: TSH: 0.643 u[IU]/mL (ref 0.450–4.500)

## 2023-09-06 ENCOUNTER — Telehealth: Payer: Self-pay | Admitting: *Deleted

## 2023-09-06 NOTE — Telephone Encounter (Signed)
 Patient states that Veronica Pollard will be $2000/month thru her [place].  She can't afford this and would like to discuss alternatives.   Macario Savin, CMA

## 2023-09-07 NOTE — Telephone Encounter (Signed)
 Patient has been scheduled. Alain Howard CMA

## 2023-09-10 ENCOUNTER — Ambulatory Visit
Admission: RE | Admit: 2023-09-10 | Discharge: 2023-09-10 | Discharge status: Home / Self Care | Source: Ambulatory Visit | Attending: Family Medicine | Admitting: Family Medicine

## 2023-09-10 DIAGNOSIS — Z1509 Genetic susceptibility to other malignant neoplasm: Secondary | ICD-10-CM

## 2023-09-12 ENCOUNTER — Ambulatory Visit: Payer: Self-pay | Admitting: Student

## 2023-09-14 ENCOUNTER — Ambulatory Visit
Admission: RE | Admit: 2023-09-14 | Discharge: 2023-09-14 | Disposition: A | Discharge status: Home / Self Care | Source: Ambulatory Visit | Attending: Family Medicine

## 2023-09-14 ENCOUNTER — Ambulatory Visit: Payer: Self-pay | Admitting: Student

## 2023-09-14 DIAGNOSIS — M7989 Other specified soft tissue disorders: Secondary | ICD-10-CM

## 2023-09-17 ENCOUNTER — Ambulatory Visit: Payer: Self-pay | Admitting: Family Medicine

## 2023-09-17 NOTE — Progress Notes (Signed)
 Discussed results with patient of ABIs with borderline peripheral arterial disease in the right lower extremity.  Patient is consistently taking Crestor  with improvement in her hyperlipidemia.  She is also working on her diet and exercise.  She denies any claudication with walking.  Discussed continuing current management and letting me know if this worsens.  At that time, can consider antiplatelets, cilostazol, and referral to vascular surgery for further management and surveillance.

## 2023-10-01 ENCOUNTER — Telehealth: Payer: Self-pay | Admitting: *Deleted

## 2023-10-01 DIAGNOSIS — I10 Essential (primary) hypertension: Secondary | ICD-10-CM

## 2023-10-01 DIAGNOSIS — E7800 Pure hypercholesterolemia, unspecified: Secondary | ICD-10-CM

## 2023-10-01 NOTE — Telephone Encounter (Signed)
 Pt is moving to [place] ( Dr. Marlee was aware).  Wants to know if Dr. Jerrie will send in a 90 days script of cholesterol and BP meds.  This will allow her time to find a new PCP in [place].  She would prefer not to make an appt if at all possible, since she has been to several recently.   Harlene Carte, CMA

## 2023-10-03 NOTE — Telephone Encounter (Signed)
 Patient calls nurse line to followup on request.   Advised will forward to PCP.

## 2023-10-04 MED ORDER — TELMISARTAN-HCTZ 40-12.5 MG PO TABS: 1.0000 | ORAL_TABLET | Freq: Every day | ORAL | 0 refills | Status: DC

## 2023-10-04 MED ORDER — ROSUVASTATIN CALCIUM 20 MG PO TABS: 20.0000 mg | ORAL_TABLET | Freq: Every day | ORAL | 0 refills | Status: AC

## 2023-10-05 NOTE — Telephone Encounter (Signed)
 Rx sent on 7/17

## 2023-12-05 ENCOUNTER — Ambulatory Visit (INDEPENDENT_AMBULATORY_CARE_PROVIDER_SITE_OTHER)

## 2023-12-05 DIAGNOSIS — M722 Plantar fascial fibromatosis: Secondary | ICD-10-CM | POA: Diagnosis not present

## 2023-12-05 DIAGNOSIS — D492 Neoplasm of unspecified behavior of bone, soft tissue, and skin: Secondary | ICD-10-CM

## 2023-12-05 MED ORDER — TRIAMCINOLONE ACETONIDE 10 MG/ML IJ SUSP
5.0000 mg | Freq: Once | INTRAMUSCULAR | Status: AC
  Administered 2023-12-05: 5 mg via INTRAMUSCULAR

## 2023-12-05 NOTE — Progress Notes (Signed)
 Subjective:  Patient ID: Veronica Pollard, female    DOB: [birth year],  MRN: [id number]  Chief Complaint  Patient presents with   Foot Pain    Plantar heel bilateral (R>L) - aching x 6 weeks, history fasciitis, took meloxicam  years ago which helped a lot, also having swelling in both feet, PCP sent for US -negative blood clots, recommended compression hose-says they are really uncomfortable, tried Ibuprofen PRN  Evaluate 5th toe left - 3 surgeries, but thought it was going to help the callused area sub 5th MPJ-really tender today, had orthotics made in past   New Patient (Initial Visit)    Est pt 2023    53 y.o. female presents with the above complaint.  She states that over the last 6 weeks, she has developed significant heel pain bilaterally.  She saw her primary care provider who ordered a ultrasound that did not show any clots.  She has attempted ibuprofen recently with minimal relief.  In the past, she has utilized meloxicam  and still has some meloxicam  remaining.  Review of Systems: Negative except as noted in the HPI. Denies N/V/F/Ch.  Past Medical History:  Diagnosis Date   Carpal tunnel syndrome    Complication of anesthesia    High cholesterol    Hypertension    IBS (irritable bowel syndrome)    Osteoarthritis    PONV (postoperative nausea and vomiting)    Renal disorder    early stage kidney disease - patient states not correct   Ruptured disc, cervical     Current Outpatient Medications:    VITAMIN D  PO, Take by mouth., Disp: , Rfl:    ondansetron  (ZOFRAN -ODT) 4 MG disintegrating tablet, Take 1 tablet (4 mg total) by mouth every 8 (eight) hours as needed for nausea or vomiting., Disp: 20 tablet, Rfl: 0   rosuvastatin  (CRESTOR ) 20 MG tablet, Take 1 tablet (20 mg total) by mouth daily., Disp: 90 tablet, Rfl: 0   telmisartan -hydrochlorothiazide (MICARDIS  HCT) 40-12.5 MG tablet, Take 1 tablet by mouth daily., Disp: 90 tablet, Rfl: 0  Social History   Tobacco Use  Smoking  Status Light Smoker   Types: Cigarettes  Smokeless Tobacco Never  Tobacco Comments   3-4 cigarettes a day. 07/31/2021    Allergies  Allergen Reactions   Penicillins Shortness Of Breath and Swelling    Swollen throat, difficult breathing   Cefazolin  Itching    Itching, tongue swelling   Tramadol  Nausea And Vomiting   Objective:  There were no vitals filed for this visit. There is no height or weight on file to calculate BMI. Constitutional Well developed. Well nourished. Oriented to person, place, and time.  Vascular Dorsalis pedis pulses palpable bilaterally. Posterior tibial pulses palpable bilaterally. Capillary refill normal to all digits.  No cyanosis or clubbing noted. Pedal hair growth normal.  Neurologic Normal speech. Epicritic sensation to light touch grossly present bilaterally. Negative tinel sign at tarsal tunnel bilaterally.   Dermatologic Skin texture and turgor are within normal limits.  No open wounds. Hyperkeratotic lesion with central fibrotic core noted to plantar left 5th MTP. Painful to direct pressure, minimal pain with side to side palpation.   Musculoskeletal: Normal HF and 1st MTP ROM without pain or crepitus bilaterally. Rectus footshape. Tender to palpation at the calcaneal tuber bilaterally. No pain with calcaneal squeeze bilaterally. Ankle ROM diminished range of motion bilaterally. Silfverskiold Test: positive bilaterally.   Radiographs: Taken and reviewed. No acute fractures or dislocations. No evidence of stress fracture.  Small heel spur present bilaterally.  Posterior heel spur absent bilaterally. Rectus foot shape with maintained joint spaces bilaterally.  The left fifth metatarsal head has been resected with minimal heterotopic ossification.  Assessment:   1. Plantar fasciitis   2. Neoplasm of skin    Plan:  Patient was evaluated and treated and all questions answered.  Plantar Fasciitis, bilaterally with right worse than left - XR  reviewed as above.  - Educated on icing and stretching. Instructions given.  - Discussed supportive shoegear and use of OTC insoles. Discussed that if insoles are effective, patient would benefit from custom molded insoles to better match foot shape.  She will attempt OTC insoles for time being.  - Discussed treatment options to reduce inflammation in plantar fascia. Risks and benefits discussed. The patient would like to proceed with localized steroid injection as described below..  Porokeratosis/neoplasm of skin - Discussed diagnosis of skin neoplasm that is most consistent with a plugged sweat gland, this is directly plantar to the fifth metatarsal phalangeal joint that was surgically resected in the past. - Porokeratosis was debrided today without incident - Discussed use of dancers pad which was provided - Discussed use of urea cream to soften the hardened tissue   Procedure: Injection Tendon/Ligament Location: Right plantar fascia at the glabrous junction; medial approach. Skin Prep: alcohol Injectate: 1 cc 0.5% marcaine  plain, 1 cc of 1% Lidocaine , 0.5 cc kenalog  10. Disposition: Patient tolerated procedure well. Injection site dressed with a band-aid.   Follow-up as needed  Prentice Ovens, [facility] Trained Podiatric Surgeon [facility]

## 2023-12-05 NOTE — Patient Instructions (Signed)

## 2023-12-20 ENCOUNTER — Other Ambulatory Visit: Payer: Self-pay

## 2023-12-20 DIAGNOSIS — I10 Essential (primary) hypertension: Secondary | ICD-10-CM

## 2024-04-24 ENCOUNTER — Other Ambulatory Visit: Payer: Self-pay

## 2024-04-24 DIAGNOSIS — I10 Essential (primary) hypertension: Secondary | ICD-10-CM

## 2024-04-25 MED ORDER — TELMISARTAN-HCTZ 40-12.5 MG PO TABS: 1.0000 | ORAL_TABLET | Freq: Every day | ORAL | 0 refills | Status: AC

## 2024-05-01 ENCOUNTER — Ambulatory Visit: Payer: Self-pay
